# Patient Record
Sex: Female | Born: 1955 | Hispanic: No | Smoking: Never smoker
Health system: Southern US, Community
[De-identification: ages and names within clinical notes are randomized; demographics above are authoritative.]

## PROBLEM LIST (undated history)

## (undated) DIAGNOSIS — C50919 Malignant neoplasm of unspecified site of unspecified female breast: Secondary | ICD-10-CM

## (undated) DIAGNOSIS — IMO0002 Reserved for concepts with insufficient information to code with codable children: Secondary | ICD-10-CM

## (undated) HISTORY — PX: OTHER SURGICAL HISTORY: SHX169

## (undated) NOTE — *Deleted (*Deleted)
[facility]  [phone] [phone]     ID: Kelly Yu DOB: June 01, 1955  MR#: [id number]  JYN#:[id number]  Patient Care Team: Patient, No Pcp Per as PCP - General (General Practice) [name], [name], MD as Consulting Physician (Oncology) [name], MD as Consulting Physician (General Surgery) [name], MD as Consulting Physician (Radiation Oncology) [name], NP as Nurse Practitioner (Hematology and Oncology) [name] OTHER MD:  CHIEF COMPLAINT: Invasive ductal carcinoma, unknown prognostic panel  CURRENT TREATMENT: anastrozole   INTERVAL HISTORY: Kelly Yu returns today for follow-up of her estrogen receptor positive breast cancer accompanied by her daughter and arabic Nurse, learning disability.  She continues on anastrozole.  She tolerates this with no side effects that she is aware of.  At her last visit, she was noted to have a palpable area at the site of her previous right lumpectomy. She underwent bilateral diagnostic mammography with tomography and right breast ultrasonography at [facility] on 03/11/2019 showing: breast density category B; although probably related to changes of fat necrosis, 1.1 cm oval mass in right breast is considered suspicious.   She proceeded to biopsy of the right breast area in question on 03/16/2019. Pathology [id number]) showed dense fibrosis with calcifications.  Of note, she also had pap smear done the same day, which was negative.   REVIEW OF SYSTEMS: Kelly Yu    COVID 19 VACCINATION STATUS:    BREAST CANCER HISTORY: On the original intake note:  Kelly Yu tells me she had pain in the right breast, which brought her to a physician in [place], where she normally resides. They obtained a mammogram, but she does not know those results. 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 There is no prognostic panel  More recently, while visiting family in [place], the patient presented to the emergency department with a complaint of right breast pain and redness. She was afebrile. Needle aspiration was obtained and the patient was started on Augmentin. The aspirate cultures remain negative.  On 12/22/2015 [facility] This showed, at the 11:00 position of the right breast, 8 cm from the nipple, a large complex cystic region measuring up to 8.5 cm. There was no hyperemia.   The patient was referred to surgery, and on 01/18/2016 she had bilateral diagnostic mammography with tomography and right breast [facility]. The breast density was category B. In the upper outer right breast there was a 5.7 cm mass, with an associated 1 cm satellite. There was a large firm mass deforming the contour of the breast and targeted ultrasound demonstrated a large complex collection in the upper outer quadrant of the breast measuring up to 4.8 cm. A borderline lymph node with a moderately thickened cortex was visualized in the right axilla.  Biopsy of the right breast mass and right axillary lymph node 01/25/2016 showed ([id number]) no evidence of malignancy. Cultures from the 01/25/2016 biopsy all remained negative.  She was referred to radiation oncology to receive adjuvant radiation. Her subsequent history is as detailed below   PAST MEDICAL HISTORY: Past Medical History:  Diagnosis Date  . Breast cancer (HCC)    Diagnosed in [place] in July 2017, Stage IA, T1c, Nx, Mx invasive ductal carcinoma  . Ulcer  PAST SURGICAL HISTORY: Past Surgical History:  Procedure Laterality Date  . right breast lumpectomy      FAMILY HISTORY Family History  Problem Relation Age of Onset  . Diabetes Sister   The  patient's father died from renal failure in his 39s the patient's mother died from a blood clot in her 1s. The patient had 4 brothers, 8 sisters. There is no history of breast or ovarian cancer in the family to the patient's knowledge   GYNECOLOGIC HISTORY:  No LMP recorded. Patient is postmenopausal. Menarche age 49, first live birth age 21, the patient is GX B8. She is now postmenopausal. She never took hormone replacement.   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 While visiting in the [place] she lives with her daughter Kelly Yu. Kelly Yu works as a Neurosurgeon. Also at home are the patient's son-in-law Kelly Yu , who works for [employer], and their 3 children.    ADVANCED DIRECTIVES: The patient's daughter Kelly Yu and son-in-law [name] are jointly the patient's healthcare power of attorney    HEALTH MAINTENANCE: Social History   Tobacco Use  . Smoking status: Never Smoker  . Smokeless tobacco: Never Used  Vaping Use  . Vaping Use: Never used  Substance Use Topics  . Alcohol use: No  . Drug use: No     Colonoscopy:  PAP: 03/2019, negative  Bone density:   No Known Allergies  Current Outpatient Medications  Medication Sig Dispense Refill  . anastrozole (ARIMIDEX) 1 MG tablet Take 1 tablet (1 mg total) by mouth daily. 180 tablet 1  . cholecalciferol (VITAMIN D) 1000 units tablet Take 1 tablet (1,000 Units total) by mouth daily. 90 tablet 4  . triamcinolone cream (KENALOG) 0.1 % Apply 1 application topically 2 (two) times daily. 30 g 0   No current facility-administered medications for this visit.    OBJECTIVE: Middle-aged Middle [place] woman in no acute distress  There were no vitals filed for this visit.   There is no height or weight on file to calculate BMI.    ECOG FS:1 - Symptomatic but completely ambulatory  Sclerae unicteric, EOMs intact Wearing a mask No cervical or supraclavicular adenopathy  Lungs no rales or rhonchi Heart regular rate and rhythm Abd soft, nontender, positive bowel sounds MSK no focal spinal tenderness, no upper extremity lymphedema Neuro: nonfocal, well oriented, appropriate affect Breasts:    {Sclerae unicteric, EOMs intact Wearing a mask No cervical or supraclavicular adenopathy Lungs no rales or rhonchi Heart regular rate and rhythm Abd soft, nontender, positive bowel sounds MSK no focal spinal tenderness, no upper extremity lymphedema Neuro: nonfocal, well oriented, appropriate affect Breasts: The right breast is status post lumpectomy and radiation.  There are significant irregularities including a hard mass associated with the scar and a second mass in the lower inner quadrant.  The left breast is unremarkable.  Both axillae are benign}   LAB RESULTS:  CMP     Component Value Date/Time   NA 140 03/08/2019 1017   NA 141 12/15/2016 1606   K 4.2 03/08/2019 1017   K 4.1 12/15/2016 1606   CL 106 03/08/2019 1017   CO2 26 03/08/2019 1017   CO2 27 12/15/2016 1606   GLUCOSE 103 (H) 03/08/2019 1017   GLUCOSE 124 12/15/2016 1606   BUN 15 03/08/2019 1017   BUN 17.1 12/15/2016 1606   CREATININE 0.75 03/08/2019 1017   CREATININE 0.8 12/15/2016 1606  CALCIUM 8.9 03/08/2019 1017   CALCIUM 9.3 12/15/2016 1606   PROT 7.7 03/08/2019 1017   PROT 7.5 12/15/2016 1606   ALBUMIN 3.9 03/08/2019 1017   ALBUMIN 3.7 12/15/2016 1606   AST 19 03/08/2019 1017   AST 21 12/15/2016 1606   ALT 12 03/08/2019 1017   ALT 16 12/15/2016 1606   ALKPHOS 85 03/08/2019 1017   ALKPHOS 103 12/15/2016 1606   BILITOT 0.4 03/08/2019 1017   BILITOT 0.36 12/15/2016 1606   GFRNONAA >60 03/08/2019 1017   GFRAA >60 03/08/2019 1017    INo results found for: SPEP, UPEP  Lab Results  Component Value Date   WBC 6.8 03/08/2019   NEUTROABS 3.9 03/08/2019   HGB 12.0 03/08/2019   HCT 36.7 03/08/2019   MCV 90.2 03/08/2019   PLT 204 03/08/2019      Chemistry      Component  Value Date/Time   NA 140 03/08/2019 1017   NA 141 12/15/2016 1606   K 4.2 03/08/2019 1017   K 4.1 12/15/2016 1606   CL 106 03/08/2019 1017   CO2 26 03/08/2019 1017   CO2 27 12/15/2016 1606   BUN 15 03/08/2019 1017   BUN 17.1 12/15/2016 1606   CREATININE 0.75 03/08/2019 1017   CREATININE 0.8 12/15/2016 1606      Component Value Date/Time   CALCIUM 8.9 03/08/2019 1017   CALCIUM 9.3 12/15/2016 1606   ALKPHOS 85 03/08/2019 1017   ALKPHOS 103 12/15/2016 1606   AST 19 03/08/2019 1017   AST 21 12/15/2016 1606   ALT 12 03/08/2019 1017   ALT 16 12/15/2016 1606   BILITOT 0.4 03/08/2019 1017   BILITOT 0.36 12/15/2016 1606       No results found for: LABCA2  No components found for: LABCA125  No results for input(s): INR in the last 168 hours.  Urinalysis    Component Value Date/Time   COLORURINE YELLOW 11/13/2012 1059   APPEARANCEUR CLEAR 11/13/2012 1059   LABSPEC 1.010 11/13/2012 1059   PHURINE 8.5 (H) 11/13/2012 1059   GLUCOSEU NEGATIVE 11/13/2012 1059   HGBUR NEGATIVE 11/13/2012 1059   BILIRUBINUR NEGATIVE 11/13/2012 1059   KETONESUR NEGATIVE 11/13/2012 1059   PROTEINUR NEGATIVE 11/13/2012 1059   UROBILINOGEN 0.2 11/13/2012 1059   NITRITE NEGATIVE 11/13/2012 1059   LEUKOCYTESUR TRACE (A) 11/13/2012 1059    STUDIES: No results found.   ELIGIBLE FOR AVAILABLE RESEARCH PROTOCOL: No  ASSESSMENT: 15 y.o. [place] Woman, originally from [place], status post right breast upper outer quadrant lumpectomy July 2017 for a pT1c pNX, stage IA invasive ductal carcinoma,  grade 3, prognostic panel not available  (1) complex cystic mass in the right breast and suspicious right axillary lymph node both biopsied 01/25/2016, showing no evidence of malignancy. Multiple cultures were negative as well   (2) adjuvant radiation 03/23/2016 to 05/15/2015:  (a) Right Breast / 50.4 Gy in 28 fractions  (b) Boost / 14 Gy in 7 fractions  (3) started anastrozole 06/05/2016  (a) bone  density at the [facility] 12/08/2016 finds a T score of -1.5   PLAN: Kelly Yu is now a little over 3 years out from definitive surgery for breast cancer with no evidence of disease recurrence.  This is very favorable.  She is tolerating anastrozole well and the plan is to continue that for a minimum of 5 years.  She will be leaving for [place] 03/19/2019.  It is not clear when she will be able to obtain a visa to return.  I  have refilled her anastrozole here so she has a 56-month supply and I have written her a paper prescription that she can take to [place] and get it refilled there.  I also gave her my card so her doctors in [place] can call me if they have any questions.  I am concerned about the irregularities in the right breast and I have put her in for her mammogram and ultrasound this week.  If they can do the left breast that will save her having to have mammography again in March since he will not be in the country at that time  Assuming she is back in the [place] a year from now she will see me then.  She knows to call for any other issue that may develop before that visit.   Valentino Yu. [name], MD  03/07/20 11:34 PM Medical Oncology and Hematology [facility] [address] [place], [place] [address] Tel. [phone]    Fax. [phone]   I, Mickie Bail, am acting as scribe for Dr. Valentino Yu. [name].  I, Ruthann Cancer MD, have reviewed the above documentation for accuracy and completeness, and I agree with the above.   *Total Encounter Time as defined by the Centers for Medicare and Medicaid Services includes, in addition to the face-to-face time of a patient visit (documented in the note above) non-face-to-face time: obtaining and reviewing outside history, ordering and reviewing medications, tests or procedures, care coordination (communications with other health care professionals or caregivers) and documentation in the medical record.

---

## 2012-11-13 ENCOUNTER — Other Ambulatory Visit (HOSPITAL_COMMUNITY): Payer: Self-pay

## 2012-11-13 ENCOUNTER — Emergency Department (HOSPITAL_COMMUNITY): Payer: Self-pay

## 2012-11-13 ENCOUNTER — Emergency Department (HOSPITAL_COMMUNITY)
Admission: EM | Admit: 2012-11-13 | Discharge: 2012-11-13 | Disposition: A | Discharge status: Home / Self Care | Payer: Self-pay | Attending: Emergency Medicine | Admitting: Emergency Medicine

## 2012-11-13 ENCOUNTER — Encounter (HOSPITAL_COMMUNITY): Payer: Self-pay | Admitting: Family Medicine

## 2012-11-13 DIAGNOSIS — Z872 Personal history of diseases of the skin and subcutaneous tissue: Secondary | ICD-10-CM | POA: Insufficient documentation

## 2012-11-13 DIAGNOSIS — R51 Headache: Secondary | ICD-10-CM | POA: Insufficient documentation

## 2012-11-13 DIAGNOSIS — R5383 Other fatigue: Secondary | ICD-10-CM | POA: Insufficient documentation

## 2012-11-13 DIAGNOSIS — R42 Dizziness and giddiness: Secondary | ICD-10-CM | POA: Insufficient documentation

## 2012-11-13 DIAGNOSIS — R5381 Other malaise: Secondary | ICD-10-CM | POA: Insufficient documentation

## 2012-11-13 HISTORY — DX: Reserved for concepts with insufficient information to code with codable children: IMO0002

## 2012-11-13 LAB — BASIC METABOLIC PANEL
BUN: 15 mg/dL (ref 6–23)
Calcium: 9.2 mg/dL (ref 8.4–10.5)
Creatinine, Ser: 0.6 mg/dL (ref 0.50–1.10)
GFR calc Af Amer: >90 mL/min (ref >90)
GFR calc non Af Amer: >90 mL/min (ref >90)
Potassium: 3.8 mEq/L (ref 3.5–5.1)

## 2012-11-13 LAB — CBC WITH DIFFERENTIAL/PLATELET
Basophils Relative: 0 % (ref 0–1)
Eosinophils Absolute: 0.2 10*3/uL (ref 0.0–0.7)
Hemoglobin: 12.2 g/dL (ref 12.0–15.0)
MCH: 29.1 pg (ref 26.0–34.0)
MCHC: 33.7 g/dL (ref 30.0–36.0)
Monocytes Relative: 8 % (ref 3–12)
Neutrophils Relative %: 64 % (ref 43–77)

## 2012-11-13 LAB — URINALYSIS, ROUTINE W REFLEX MICROSCOPIC
Bilirubin Urine: NEGATIVE
Ketones, ur: NEGATIVE mg/dL
Nitrite: NEGATIVE
Urobilinogen, UA: 0.2 mg/dL (ref 0.0–1.0)
pH: 8.5 — ABNORMAL HIGH (ref 5.0–8.0)

## 2012-11-13 LAB — POCT I-STAT TROPONIN I

## 2012-11-13 MED ORDER — SODIUM CHLORIDE 0.9 % IV BOLUS (SEPSIS)
1000.0000 mL | Freq: Once | INTRAVENOUS | Status: AC
  Administered 2012-11-13: 1000 mL via INTRAVENOUS

## 2012-11-13 MED ORDER — KETOROLAC TROMETHAMINE 30 MG/ML IJ SOLN
30.0000 mg | Freq: Once | INTRAMUSCULAR | Status: AC
  Administered 2012-11-13: 30 mg via INTRAVENOUS
  Filled 2012-11-13: qty 1

## 2012-11-13 MED ORDER — PROMETHAZINE HCL 25 MG/ML IJ SOLN
12.5000 mg | Freq: Once | INTRAMUSCULAR | Status: AC
  Administered 2012-11-13: 12.5 mg via INTRAVENOUS
  Filled 2012-11-13: qty 1

## 2012-11-13 MED ORDER — OXYCODONE-ACETAMINOPHEN 5-325 MG PO TABS
1.0000 | ORAL_TABLET | Freq: Once | ORAL | Status: AC
  Administered 2012-11-13: 1 via ORAL
  Filled 2012-11-13: qty 1

## 2012-11-13 NOTE — Progress Notes (Signed)
Met with  Patient,son in law at bedside, case manager role explained.Patient does not have a PCP. Patient provided with resource sheet for [facility].Patient,son in law verbalized understanding of case Production designer, theatre/television/film education.

## 2012-11-13 NOTE — ED Notes (Signed)
Patient transported to CT 

## 2012-11-13 NOTE — ED Provider Notes (Signed)
History    CSN: [id number] Arrival date & time 11/13/12  4098  First MD Initiated Contact with Patient 11/13/12 0957     Chief Complaint  Patient presents with  . Dizziness  . Fall   (Consider location/radiation/quality/duration/timing/severity/associated sxs/prior Treatment) HPI Comments: 57 y.o. Female with distant history of stomach ulcers (21 years ago) presents today complaining of gradual onset frontal headache with associated dizziness. Pt speaks Arabic. Son-in-law served as Nurse, learning disability. Pt describes the headache as gradual onset, severe (9/10), throbbing, constant, frontal radiating to the back of her head. Pt states that she had the headache when she woke up (pt sleeps on the floor due to back pain). She got up to go to the bathroom, laid back down, and started to feel dizzy when she sat up to get up again. She called for her daughter and son-in-law with whom she lives, stating she was feeling dizzy and week. Pt did not "fall" as per nurse's note, but clarified by son-in-law that she was sitting up and "fell" back to lie down due to being weak as sitting up was too much effort.  Pt is fasting for Ramadan, but reports never having felt dizzy before during the fast. Pt is visiting from [place]   Patient is a 57 y.o. female presenting with fall.  Fall Associated symptoms include headaches and weakness. Pertinent negatives include no chest pain, diaphoresis, fever, nausea, neck pain, numbness, rash or vomiting.   Past Medical History  Diagnosis Date  . Ulcer    History reviewed. No pertinent past surgical history. History reviewed. No pertinent family history. History  Substance Use Topics  . Smoking status: Never Smoker   . Smokeless tobacco: Not on file  . Alcohol Use: No   OB History   Grav Para Term Preterm Abortions TAB SAB Ect Mult Living                 Review of Systems  Constitutional: Negative for fever and diaphoresis.  HENT: Negative for neck pain and neck  stiffness.   Eyes: Negative for visual disturbance.  Respiratory: Negative for apnea, chest tightness and shortness of breath.   Cardiovascular: Negative for chest pain and palpitations.  Gastrointestinal: Negative for nausea, vomiting, diarrhea and constipation.  Genitourinary: Negative for dysuria.  Musculoskeletal: Negative for gait problem.  Skin: Negative for rash.  Neurological: Positive for weakness and headaches. Negative for dizziness, syncope, light-headedness and numbness.       Frontal    Allergies  Review of patient's allergies indicates no known allergies.  Home Medications  No current outpatient prescriptions on file. BP 144/76  Pulse 64  Temp(Src) 98.1 F (36.7 C) (Oral)  Resp 16  SpO2 100% Physical Exam  Nursing note and vitals reviewed. Constitutional: She is oriented to person, place, and time. She appears well-developed and well-nourished. No distress.  HENT:  Head: Normocephalic and atraumatic.  Eyes: Conjunctivae and EOM are normal. Pupils are equal, round, and reactive to light.  Neck: Normal range of motion. Neck supple.  No meningeal signs  Cardiovascular: Normal rate, regular rhythm and normal heart sounds.  Exam reveals no gallop and no friction rub.   No murmur heard. Pulmonary/Chest: Effort normal and breath sounds normal. No respiratory distress. She has no wheezes. She has no rales. She exhibits no tenderness.  Abdominal: Soft. Bowel sounds are normal. She exhibits no distension. There is no tenderness. There is no rebound and no guarding.  Musculoskeletal: Normal range of motion. She exhibits no edema  and no tenderness.  FROM to upper and lower extremities No step-offs noted on C-spine No tenderness to palpation of the spinous processes of the C-spine, T-spine or L-spine Full range of motion of C-spine, T-spine or L-spine No tenderness to palpation of the paraspinous muscles   Neurological: She is alert and oriented to person, place, and time.  No cranial nerve deficit.  Speech is clear and goal oriented, follows commands Sensation normal to light touch and two point discrimination Moves extremities without ataxia, coordination intact Normal gait and balance Normal strength in upper and lower extremities bilaterally including dorsiflexion and plantar flexion, strong and equal grip strength   Skin: Skin is warm and dry. She is not diaphoretic. No erythema.  Psychiatric: She has a normal mood and affect.    ED Course  Procedures (including critical care time) Labs Reviewed  URINALYSIS, ROUTINE W REFLEX MICROSCOPIC - Abnormal; Notable for the following:    pH 8.5 (*)    Leukocytes, UA TRACE (*)    All other components within normal limits  BASIC METABOLIC PANEL - Abnormal; Notable for the following:    Glucose, Bld 126 (*)    All other components within normal limits  CBC WITH DIFFERENTIAL  URINE MICROSCOPIC-ADD ON  POCT I-STAT TROPONIN I   Dg Chest 2 View  11/13/2012   *RADIOLOGY REPORT*  Clinical Data: Severe chest pain.  CHEST - 2 VIEW  Comparison: The  Findings: The heart size is upper limits of normal.  The lungs are clear.  There is no edema or effusion to suggest failure.  The visualized soft tissues and bony thorax are unremarkable.  IMPRESSION:  1.  The heart size is upper limits of normal without evidence for failure. 2.  No acute cardiopulmonary disease.   Original Report Authenticated By: Marin Roberts, M.D.   Ct Head Wo Contrast  11/13/2012   *RADIOLOGY REPORT*  Clinical Data:  Dizziness and headache.  CT HEAD WITHOUT CONTRAST  Technique:  Contiguous axial images were obtained from the base of the skull through the vertex without contrast  Comparison:  None.  Findings:  The brain has a normal appearance without evidence for hemorrhage, acute infarction, hydrocephalus, or mass lesion.  There is no extra axial fluid collection.  The skull and paranasal sinuses are normal.  IMPRESSION: Normal CT of the head without  contrast.   Original Report Authenticated By: Janeece Riggers, M.D.   1. Dizziness   2. Headache     MDM  Pt HA treated and improved while in ED.  Presentation is like typical HA and non concerning for [facility], ICH, Meningitis, or temporal arteritis. Pt is afebrile with no focal neuro deficits, nuchal rigidity, or change in vision. Pt is visiting from [place] and has no PCP follow up. Provided resource list, discussed availability of urgent cares, and reasons to return to the ED.  At this time there does not appear to be any evidence of an acute emergency medical condition and the patient appears stable for discharge with appropriate outpatient follow up.Diagnosis was discussed with patient who verbalizes understanding and is agreeable to discharge. Pt case discussed with Dr. Fredderick Phenix who agrees with my plan.      Glade Nurse, PA-C 11/13/12 1745

## 2012-11-13 NOTE — ED Notes (Addendum)
Per family pt woke up this am and was feeling very dizzy and fell. sts still currently dizzy. sts heaviness in head. sts some nausea. Denies hitting head or LOC. Per family they are currently fasting for religious reasons.

## 2012-11-13 NOTE — ED Notes (Signed)
Ambulated to bathroom without difficulty, daughter at side. C/o headache

## 2012-11-14 NOTE — ED Provider Notes (Signed)
Medical screening examination/treatment/procedure(s) were performed by non-physician practitioner and as supervising physician I was immediately available for consultation/collaboration.   Brennyn Ortlieb, MD 11/14/12 0715 

## 2015-12-22 ENCOUNTER — Emergency Department (HOSPITAL_COMMUNITY): Payer: Self-pay

## 2015-12-22 ENCOUNTER — Emergency Department (HOSPITAL_COMMUNITY)
Admission: EM | Admit: 2015-12-22 | Discharge: 2015-12-22 | Disposition: A | Discharge status: Home / Self Care | Payer: Self-pay | Attending: Emergency Medicine | Admitting: Emergency Medicine

## 2015-12-22 ENCOUNTER — Encounter (HOSPITAL_COMMUNITY): Payer: Self-pay | Admitting: *Deleted

## 2015-12-22 DIAGNOSIS — N611 Abscess of the breast and nipple: Secondary | ICD-10-CM | POA: Insufficient documentation

## 2015-12-22 DIAGNOSIS — N63 Unspecified lump in unspecified breast: Secondary | ICD-10-CM | POA: Diagnosis present

## 2015-12-22 DIAGNOSIS — IMO0002 Reserved for concepts with insufficient information to code with codable children: Secondary | ICD-10-CM | POA: Diagnosis present

## 2015-12-22 LAB — CBC WITH DIFFERENTIAL/PLATELET
Basophils Absolute: 0 10*3/uL (ref 0.0–0.1)
Basophils Relative: 0 %
Eosinophils Absolute: 0.3 10*3/uL (ref 0.0–0.7)
Eosinophils Relative: 4 %
HEMATOCRIT: 34.2 % — AB (ref 36.0–46.0)
HEMOGLOBIN: 11.1 g/dL — AB (ref 12.0–15.0)
LYMPHS ABS: 2.2 10*3/uL (ref 0.7–4.0)
Lymphocytes Relative: 33 %
MCH: 28.8 pg (ref 26.0–34.0)
MCHC: 32.5 g/dL (ref 30.0–36.0)
MCV: 88.6 fL (ref 78.0–100.0)
MONO ABS: 0.7 10*3/uL (ref 0.1–1.0)
MONOS PCT: 11 %
NEUTROS ABS: 3.4 10*3/uL (ref 1.7–7.7)
NEUTROS PCT: 52 %
Platelets: 188 10*3/uL (ref 150–400)
RBC: 3.86 MIL/uL — ABNORMAL LOW (ref 3.87–5.11)
RDW: 14.1 % (ref 11.5–15.5)
WBC: 6.6 10*3/uL (ref 4.0–10.5)

## 2015-12-22 LAB — BASIC METABOLIC PANEL
Anion gap: 6 (ref 5–15)
BUN: 17 mg/dL (ref 6–20)
CALCIUM: 8.8 mg/dL — AB (ref 8.9–10.3)
CHLORIDE: 105 mmol/L (ref 101–111)
CO2: 30 mmol/L (ref 22–32)
CREATININE: 0.6 mg/dL (ref 0.44–1.00)
GFR calc Af Amer: >60 mL/min (ref >60)
GFR calc non Af Amer: >60 mL/min (ref >60)
GLUCOSE: 100 mg/dL — AB (ref 65–99)
Potassium: 4.2 mmol/L (ref 3.5–5.1)
Sodium: 141 mmol/L (ref 135–145)

## 2015-12-22 LAB — CBG MONITORING, ED: Glucose-Capillary: 99 mg/dL (ref 65–99)

## 2015-12-22 MED ORDER — AMOXICILLIN-POT CLAVULANATE 875-125 MG PO TABS: 1.0000 | ORAL_TABLET | Freq: Two times a day (BID) | ORAL | 0 refills | Status: DC

## 2015-12-22 MED ORDER — CLINDAMYCIN PHOSPHATE 600 MG/50ML IV SOLN
600.0000 mg | Freq: Once | INTRAVENOUS | Status: AC
  Administered 2015-12-22: 600 mg via INTRAVENOUS
  Filled 2015-12-22: qty 50

## 2015-12-22 NOTE — ED Notes (Signed)
No respiratory or acute distress noted alert and oriented x 3 visitors at bedside call light in reach. 

## 2015-12-22 NOTE — ED Triage Notes (Signed)
Patient had a lumpectomy done in [place], her home country, last month. Daughter states patient did not receive results of biopsy, but now c/o right breast pain x2 days.  Patient denies fever and N/V.    Patient has healed incision on right breast with some local edema and erythema to under side of right breast.

## 2015-12-22 NOTE — ED Provider Notes (Signed)
[facility] DEPT Provider Note   CSN: [id number] Arrival date & time: 12/22/15  1504     History   Chief Complaint Chief Complaint  Patient presents with  . Breast Pain    HPI Kelly Yu is a 60 y.o. female.  Patient presents today with a chief complaint of right breast pain and erythema.  She had a lumpectomy six weeks ago in [place] and reports that she was told that it is was not cancerous.  She states that over the past 2-3 days she has developed pain and it is gradually worsening.  She is currently not taking any medications.  No nausea, vomiting, fever, or chills.  She has not noticed any drainage from the nipple.  She denies any other symptoms.        Past Medical History:  Diagnosis Date  . Ulcer     There are no active problems to display for this patient.   History reviewed. No pertinent surgical history.  OB History    No data available       Home Medications    Prior to Admission medications   Not on File    Family History No family history on file.  Social History Social History  Substance Use Topics  . Smoking status: Never Smoker  . Smokeless tobacco: Never Used  . Alcohol use No     Allergies   Review of patient's allergies indicates no known allergies.   Review of Systems Review of Systems  All other systems reviewed and are negative.    Physical Exam Updated Vital Signs BP 142/78 (BP Location: Left Arm)   Pulse (!) 59   Temp 98.7 F (37.1 C) (Oral)   Resp 21   SpO2 97%   Physical Exam  Constitutional: She appears well-developed and well-nourished.  HENT:  Head: Normocephalic and atraumatic.  Neck: Normal range of motion. Neck supple.  Cardiovascular: Normal rate, regular rhythm and normal heart sounds.   Pulmonary/Chest: Effort normal and breath sounds normal. Right breast exhibits skin change and tenderness. Right breast exhibits no inverted nipple and no nipple discharge.    Genitourinary: No breast  discharge or bleeding.  Musculoskeletal: Normal range of motion.  Lymphadenopathy:    She has no axillary adenopathy.  Neurological: She is alert.  Skin: Skin is warm and dry. There is erythema.  Nursing note and vitals reviewed.    ED Treatments / Results  Labs (all labs ordered are listed, but only abnormal results are displayed) Labs Reviewed  CBC WITH DIFFERENTIAL/PLATELET  BASIC METABOLIC PANEL    EKG  EKG Interpretation  Date/Time:  Saturday December 22 2015 15:12:39 EDT Ventricular Rate:  56 PR Interval:    QRS Duration: 104 QT Interval:  389 QTC Calculation: 376 R Axis:   39 Text Interpretation:  Sinus rhythm Low voltage, precordial leads Abnormal R-wave progression, early transition Atrial escape complexes Confirmed by Wyvonnia Dusky  MD, STEPHEN [id number]) on 12/22/2015 4:18:18 PM       Radiology No results found.  Procedures Procedures (including critical care time)  NEEDLE ASPIRATION Performed by: Hyman Bible Consent: Verbal consent obtained. Risks and benefits: risks, benefits and alternatives were discussed Type: abscess  Body area: right breast  Needle aspiration  Complexity: complex Blunt dissection to break up loculations  Drainage: bloody with small amount of purulent fluid  Drainage amount: 3 cc  Patient tolerance: Patient tolerated the procedure well with no immediate complications.     Medications Ordered in ED Medications  clindamycin (CLEOCIN)  IVPB 600 mg (not administered)     Initial Impression / Assessment and Plan / ED Course  I have reviewed the triage vital signs and the nursing notes.  Pertinent labs & imaging results that were available during my care of the patient were reviewed by me and considered in my medical decision making (see chart for details).  Clinical Course    7:42 PM Discussed with Dr Johney Maine with General Surgery.  He recommended attempting to aspirate the abscess with a 18 Gauge needle and starting her on  Augmentin.  He states that she can be seen by the   Final Clinical Impressions(s) / ED Diagnoses   Final diagnoses:  Abscess of right breast   Patient with a recent lumpectomy done in [place] six weeks ago presents today with increased pain and erythema of the right breast.  She is afebrile.  Exam consistent with breast abscess vs seroma.  Ultrasound performed in the ED by Ultrasound tech and was suspicious for breast abscess.  Discussed patient with Dr Johney Maine with General Surgery.  He recommended needle aspiration, Augmentin, and follow up in the office.  Needle aspiration performed.  Patient given follow up with Surgery.  She is afebrile and non toxic appearing.  No signs of systemic infection.  Stable for discharge.  Return precautions given.   New Prescriptions New Prescriptions   No medications on file     Hyman Bible, PA-C 12/25/15 [place], PA-C 12/25/15 [place], PA-C 12/25/15 [place], MD 12/26/15 [phone]

## 2015-12-22 NOTE — Discharge Instructions (Signed)
[facility] °Office Phone Number [phone] ° °BREAST BIOPSY/ PARTIAL MASTECTOMY: POST OP INSTRUCTIONS ° °Always review your discharge instruction sheet given to you by the facility where your surgery was performed. ° °IF YOU HAVE DISABILITY OR FAMILY LEAVE FORMS, YOU MUST BRING THEM TO THE OFFICE FOR PROCESSING.  DO NOT GIVE THEM TO YOUR DOCTOR. ° °1. A prescription for pain medication may be given to you upon discharge.  Take your pain medication as prescribed, if needed.  If narcotic pain medicine is not needed, then you may take acetaminophen (Tylenol) or ibuprofen (Advil) as needed. °2. Take your usually prescribed medications unless otherwise directed °3. If you need a refill on your pain medication, please contact your pharmacy.  They will contact our office to request authorization.  Prescriptions will not be filled after 5pm or on week-ends. °4. You should eat very light the first 24 hours after surgery, such as soup, crackers, pudding, etc.  Resume your normal diet the day after surgery. °5. Most patients will experience some swelling and bruising in the breast.  Ice packs and a good support bra will help.  Swelling and bruising can take several days to resolve.  °6. It is common to experience some constipation if taking pain medication after surgery.  Increasing fluid intake and taking a stool softener will usually help or prevent this problem from occurring.  A mild laxative (Milk of Magnesia or Miralax) should be taken according to package directions if there are no bowel movements after 48 hours. °7. Unless discharge instructions indicate otherwise, you may remove your bandages 24-48 hours after surgery, and you may shower at that time.  You may have steri-strips (small skin tapes) in place directly over the incision.  These strips should be left on the skin for 7-10 days.  If your surgeon used skin glue on the incision, you may shower in 24 hours.  The glue will flake off over the  next 2-3 weeks.  Any sutures or staples will be removed at the office during your follow-up visit. °8. ACTIVITIES:  You may resume regular daily activities (gradually increasing) beginning the next day.  Wearing a good support bra or sports bra minimizes pain and swelling.  You may have sexual intercourse when it is comfortable. °a. You may drive when you no longer are taking prescription pain medication, you can comfortably wear a seatbelt, and you can safely maneuver your car and apply brakes. °b. RETURN TO WORK:  ______________________________________________________________________________________ °9. You should see your doctor in the office for a follow-up appointment approximately two weeks after your surgery.  Your doctor’s nurse will typically make your follow-up appointment when she calls you with your pathology report.  Expect your pathology report 2-3 business days after your surgery.  You may call to check if you do not hear from us after three days. °10. OTHER INSTRUCTIONS: _______________________________________________________________________________________________ _____________________________________________________________________________________________________________________________________ °_____________________________________________________________________________________________________________________________________ °_____________________________________________________________________________________________________________________________________ ° °WHEN TO CALL YOUR DOCTOR: °1. Fever over 101.0 °2. Nausea and/or vomiting. °3. Extreme swelling or bruising. °4. Continued bleeding from incision. °5. Increased pain, redness, or drainage from the incision. ° °The clinic staff is available to answer your questions during regular business hours.  Please don’t hesitate to call and ask to speak to one of the nurses for clinical concerns.  If you have a medical emergency, go to the nearest  emergency room or call 911.  A surgeon from [facility] is always on call at the hospital. ° °For further questions, please visit [email or web address]  ° ° °  Lumpectomy, Care After Refer to this sheet in the next few weeks. These instructions provide you with information on caring for yourself after your procedure. Your health care provider may also give you more specific instructions. Your treatment has been planned according to current medical practices, but problems sometimes occur. Call your health care provider if you have any problems or questions after your procedure. WHAT TO EXPECT AFTER THE PROCEDURE After your procedure, it is typical to have soreness, bruising, and swelling of your breast. This is normal. You will be given medicines to control your pain. HOME CARE INSTRUCTIONS  Take medicines only as directed by your health care provider.  Resume a normal diet as directed by your health care provider.  Resume normal activity as directed by your health care provider. Avoid strenuous activity that affects the arm on the side that the surgical cut (incision) was made. Avoid playing tennis, swimming, lifting heavy objects (those that weigh more than 10 pounds [4.5 kg]), and pulling for 2 weeks.  Change bandages (dressings) as directed by your health care provider.  Consider wearing a bra to bed if you feel discomfort at the breast. Wearing a bra also helps keep dressings on.  Keep all follow-up visits as directed by your health care provider. This is important.  Call for the results of your procedure as instructed by your surgeon. It is your responsibility to get your test results. Do not assume everything is fine if you have not heard from your health care provider.  Keep the incision site dry.  If the incision site is tender, applying an ice pack may relieve some discomfort. To do this:  Put ice in a plastic bag.  Place a towel between your skin and the  bag.  Leave the ice on for 15-20 minutes, 3-4 times a day. SEEK MEDICAL CARE IF:   You have increased bleeding from the incision site.  You notice redness, swelling, or increasing pain in the incision.  You have pus coming from the incision site.  You have a fever.  You notice a foul smell coming from the incision or dressing. SEEK IMMEDIATE MEDICAL CARE IF:   You develop a rash.  You have shortness of breath.  You have chest pain.   This information is not intended to replace advice given to you by your health care provider. Make sure you discuss any questions you have with your health care provider.   Document Released: 05/07/2006 Document Revised: 05/12/2014 Document Reviewed: 11/18/2012 Elsevier Interactive Patient Education 2016 Elsevier Inc[employer]  Seroma A seroma is a collection of fluid that looks like swelling or a mass on the body. Seromas form on the body where tissue has been injured or cut. They are most common after surgeries. Seromas vary in size. Some are small and painless. Others may become large and cause pain or discomfort. Many seromas go away on their own; the fluid is naturally absorbed by the body. Some may require the fluid to be drained through medical procedures.  CAUSES  Seromas form as the result of damage to tissue or the removal of tissue. This tissue damage may occur during surgery or because of an injury or trauma. When tissue is disrupted or removed, empty space is created. The body's natural defense system causes fluid to enter the empty space and form a seroma. SYMPTOMS   Swelling at the site of a surgical cut (incision) or an injury.  Drainage of clear fluid at the surgery or injury site.  Possible discomfort or pain. DIAGNOSIS  Your health care provider will perform a physical exam. During the exam, the health care provider will press on the seroma using a hand or fingers (palpation). Various tests may be ordered to help confirm the diagnosis.  These tests may include:  Blood tests.  Imaging tests such as ultrasonography or computed tomography (CT). TREATMENT  Sometimes seromas resolve on their own and drain naturally in the body. Your health care provider may monitor you to make sure the seroma does not cause any complications. If your seroma does not resolve on its own, treatment may include:  Using a needle to drain the fluid from the seroma (needle aspiration).  Inserting a flexible tube (catheter) to drain the fluid.  Applying a dressing, such as an elastic bandage or binder.  Use of antibiotic medicines if the seroma becomes infected.  In rare cases, surgery may be done to remove the seroma and repair the area. HOME CARE INSTRUCTIONS  Follow your health care provider's instructions regarding activity levels and any limitations on movements.  Only take over-the-counter or prescription medicines as directed by your health care provider.  If your health care provider prescribes antibiotics, take them as directed. Finish them even if you start to feel better.  Check your seroma every day for redness, warmth, or yellow drainage.  Follow up with your health care provider as directed. SEEK MEDICAL CARE IF:  You develop a fever.  You have pain, tenderness, redness, or warmth at the site of the seroma.  You notice yellow drainage coming from the site of the seroma.  Your seroma is getting bigger.   This information is not intended to replace advice given to you by your health care provider. Make sure you discuss any questions you have with your health care provider.   Document Released: 08/16/2012 Document Revised: 05/12/2014 Document Reviewed: 08/16/2012 Elsevier Interactive Patient Education [employer].

## 2015-12-25 LAB — AEROBIC CULTURE W GRAM STAIN (SUPERFICIAL SPECIMEN)

## 2015-12-25 LAB — AEROBIC CULTURE  (SUPERFICIAL SPECIMEN): CULTURE: NO GROWTH

## 2016-01-01 ENCOUNTER — Ambulatory Visit: Payer: Self-pay | Attending: Internal Medicine

## 2016-01-03 ENCOUNTER — Other Ambulatory Visit: Payer: Self-pay | Admitting: General Surgery

## 2016-01-14 ENCOUNTER — Other Ambulatory Visit (HOSPITAL_COMMUNITY): Payer: Self-pay | Admitting: *Deleted

## 2016-01-14 DIAGNOSIS — Z853 Personal history of malignant neoplasm of breast: Secondary | ICD-10-CM

## 2016-01-15 ENCOUNTER — Other Ambulatory Visit (HOSPITAL_COMMUNITY): Payer: Self-pay | Admitting: *Deleted

## 2016-01-15 DIAGNOSIS — Z853 Personal history of malignant neoplasm of breast: Secondary | ICD-10-CM

## 2016-01-18 ENCOUNTER — Other Ambulatory Visit (HOSPITAL_COMMUNITY): Payer: Self-pay | Admitting: Obstetrics and Gynecology

## 2016-01-18 ENCOUNTER — Encounter (HOSPITAL_COMMUNITY): Payer: Self-pay

## 2016-01-18 ENCOUNTER — Ambulatory Visit
Admission: RE | Admit: 2016-01-18 | Discharge: 2016-01-18 | Disposition: A | Discharge status: Home / Self Care | Payer: No Typology Code available for payment source | Source: Ambulatory Visit | Attending: Obstetrics and Gynecology | Admitting: Obstetrics and Gynecology

## 2016-01-18 ENCOUNTER — Ambulatory Visit (HOSPITAL_COMMUNITY)
Admission: RE | Admit: 2016-01-18 | Discharge: 2016-01-18 | Disposition: A | Discharge status: Home / Self Care | Payer: Self-pay | Source: Ambulatory Visit | Attending: Obstetrics and Gynecology | Admitting: Obstetrics and Gynecology

## 2016-01-18 VITALS — BP 160/92 | Temp 98.2°F | Ht 63.0 in | Wt 150.0 lb

## 2016-01-18 DIAGNOSIS — N6310 Unspecified lump in the right breast, unspecified quadrant: Secondary | ICD-10-CM

## 2016-01-18 DIAGNOSIS — Z853 Personal history of malignant neoplasm of breast: Secondary | ICD-10-CM

## 2016-01-18 DIAGNOSIS — R229 Localized swelling, mass and lump, unspecified: Principal | ICD-10-CM

## 2016-01-18 DIAGNOSIS — IMO0002 Reserved for concepts with insufficient information to code with codable children: Secondary | ICD-10-CM

## 2016-01-18 DIAGNOSIS — Z01419 Encounter for gynecological examination (general) (routine) without abnormal findings: Secondary | ICD-10-CM

## 2016-01-18 DIAGNOSIS — R599 Enlarged lymph nodes, unspecified: Secondary | ICD-10-CM

## 2016-01-18 NOTE — Patient Instructions (Signed)
Explained breast self awareness with Kelly Yu. Let patient know [facility] will cover Pap smears and HPV typing every 5 years unless has a history of abnormal Pap smears. Referred patient to the [facility] for a diagnostic mammogram and possible right breast ultrasound. Appointment scheduled for Friday, January 18, 2016 at 1510. Let patient know will follow up with her within the next couple weeks with results of Pap smear by phone. Kelly Yu verbalized understanding.  Kelly Yu, Arvil Chaco, RN 3:22 PM

## 2016-01-18 NOTE — Progress Notes (Signed)
Complaints of sharp right breast pain x 1 month that comes and goes. Patient rates pain at a 5-6 out of 10. Patient had a right breast lumpectomy in July 2017 due to breast cancer. Patient states that she feels a hard area where surgery was completed with some redness. Patient stated she did not have imaging completed for the left breast. Patient stated didn't know she had breast cancer until she came to the [place]. Per patient there is no documentation if all of the cancer was removed and it is unknown if has spread.   Pap Smear: Pap smear completed today. Last Pap smear was 30 years ago per patient and normal. Per patient has no history of an abnormal Pap smear. No Pap smear results are in EPIC.  Physical exam: Breasts Left breast slightly larger than right breast due to history of right breast lumpectomy in July 2017. No skin abnormalities left breast. Right inner and lower breast red around areola. No nipple retraction bilateral breasts. No nipple discharge bilateral breasts. No lymphadenopathy. No lumps palpated left breast. Palpated a firm mass right upper outer breast between 9 o'clock and 12 o'clock. No complaints of pain or tenderness on exam. Referred patient to the [facility] for a diagnostic mammogram and possible right breast ultrasound. Appointment scheduled for Friday, January 18, 2016 at 1510.  Pelvic/Bimanual   Ext Genitalia No lesions, no swelling and no discharge observed on external genitalia.         Vagina Vagina pink and normal texture. No lesions or discharge observed in vagina.          Cervix Cervix is present. Cervix pink and of normal texture. No discharge observed.     Uterus Uterus is present and palpable. Uterus in normal position and normal size.        Adnexae Bilateral ovaries present and palpable. No tenderness on palpation.          Rectovaginal No rectal exam completed today since patient had no rectal complaints. No skin  abnormalities observed on exam.    Smoking History: Patient has never smoked.  Patient Navigation: Patient education provided. Access to services provided for patient through [facility] program. Arabic interpreter provided.    Colorectal Cancer Screening: Patient has never had a colonoscopy. No complaints today.  Used Arabic interpreter Milbert Coulter from [facility].

## 2016-01-21 LAB — CYTOLOGY - PAP

## 2016-01-22 ENCOUNTER — Encounter (HOSPITAL_COMMUNITY): Payer: Self-pay | Admitting: *Deleted

## 2016-01-24 ENCOUNTER — Other Ambulatory Visit (HOSPITAL_COMMUNITY): Payer: Self-pay | Admitting: Obstetrics and Gynecology

## 2016-01-24 DIAGNOSIS — IMO0002 Reserved for concepts with insufficient information to code with codable children: Secondary | ICD-10-CM

## 2016-01-24 DIAGNOSIS — R229 Localized swelling, mass and lump, unspecified: Principal | ICD-10-CM

## 2016-01-25 ENCOUNTER — Ambulatory Visit
Admission: RE | Admit: 2016-01-25 | Discharge: 2016-01-25 | Disposition: A | Discharge status: Home / Self Care | Payer: No Typology Code available for payment source | Source: Ambulatory Visit | Attending: Obstetrics and Gynecology | Admitting: Obstetrics and Gynecology

## 2016-01-25 ENCOUNTER — Other Ambulatory Visit (HOSPITAL_COMMUNITY): Payer: Self-pay | Admitting: Obstetrics and Gynecology

## 2016-01-25 ENCOUNTER — Other Ambulatory Visit: Payer: Self-pay

## 2016-01-25 ENCOUNTER — Other Ambulatory Visit (HOSPITAL_COMMUNITY)
Admission: RE | Admit: 2016-01-25 | Discharge: 2016-01-25 | Disposition: A | Discharge status: Home / Self Care | Payer: No Typology Code available for payment source | Source: Ambulatory Visit | Attending: Diagnostic Radiology | Admitting: Diagnostic Radiology

## 2016-01-25 DIAGNOSIS — R599 Enlarged lymph nodes, unspecified: Secondary | ICD-10-CM

## 2016-01-25 DIAGNOSIS — IMO0002 Reserved for concepts with insufficient information to code with codable children: Secondary | ICD-10-CM

## 2016-01-25 DIAGNOSIS — R229 Localized swelling, mass and lump, unspecified: Principal | ICD-10-CM

## 2016-01-30 LAB — AEROBIC/ANAEROBIC CULTURE W GRAM STAIN (SURGICAL/DEEP WOUND): Culture: NO GROWTH

## 2016-02-25 ENCOUNTER — Encounter: Payer: Self-pay | Admitting: Oncology

## 2016-02-25 ENCOUNTER — Telehealth: Payer: Self-pay | Admitting: Oncology

## 2016-02-25 NOTE — Telephone Encounter (Signed)
Appt scheduled w/[name] on 11/9 @430pm . Spoke to the patient's son in law and gave him the appt. He states that his wife, patient's daughter, will be coming with her to the appts. Letter and directions mailed to the pt's address.

## 2016-03-04 NOTE — Progress Notes (Addendum)
Location of Breast Cancer: Right Breast Upper outer quadrant  (Needs Interpreter)  Histology per Pathology Report: F/22/2017INAL DIAGNOSIS Diagnosis 01/25/2016:  1. Breast, right, needle core biopsy, upper outer quadrant 438 cm complex cyst collection,  - NONDIAGNOSTIC MATERIAL, SEE COMMENT. 2. Lymph node, needle/core biopsy, right axillary - ONE BENIGN PARTIALLY SAMPLED LYMPH NODE WITH NO TUMOR SEEN (0/1). Diagnosis 9/22/217: BREAST, RIGHT, FINE NEEDLE ASPIRATION (SPECIMEN 1 OF 1,COLLECTED 01/25/16): THE SPECIMEN CONSISTS ALMOST ENTIRELY OF ACELLULAR DEBRIS, SEE COMMENT Receptor Status: ER(), PR (), Her2-neu (), Ki-()  Did patient present with symptoms (if so, please note symptoms) or was this found on screening mammography?:   Past/Anticipated interventions by surgeon, if any: Right Breast Lumpectomy in [place]  July 2017,  Clinic named [facility]  Dr. Rolm Bookbinder, MD seen on 02/20/2016   Past/Anticipated interventions by medical oncology, if any: Chemotherapy : Dr. Jana Hakim 03/13/2016 appt  Lymphedema issues, if any:  No  Pain issues, if any:  NO  SAFETY ISSUES:  Prior radiation?  No  Pacemaker/ICD? No  Possible current pregnancy? NO  Is the patient on methotrexate? No  Current Complaints / other details:  Menarche  Age 60,  Widowed, G8 P8,  8 children , menopause,  States has stomach ulcer, no meds, watches diet,  BP (!) 164/83 (BP Location: Left Arm, Patient Position: Sitting, Cuff Size: Normal)   Pulse 78   Temp 97.9 F (36.6 C) (Oral)   Resp 16   Ht 5' 3"  (1.6 m)   Wt 154 lb (69.9 kg)   BMI 27.28 kg/m   Wt Readings from Last 3 Encounters:  03/06/16 154 lb (69.9 kg)  01/18/16 150 lb (68 kg)      Rebecca Eaton, RN 03/04/2016,3:26 PM  Allergies:NKA

## 2016-03-06 ENCOUNTER — Encounter: Payer: Self-pay | Admitting: Radiation Oncology

## 2016-03-06 ENCOUNTER — Ambulatory Visit
Admission: RE | Admit: 2016-03-06 | Discharge: 2016-03-06 | Disposition: A | Discharge status: Home / Self Care | Payer: Self-pay | Source: Ambulatory Visit | Attending: Radiation Oncology | Admitting: Radiation Oncology

## 2016-03-06 ENCOUNTER — Institutional Professional Consult (permissible substitution): Payer: No Typology Code available for payment source | Admitting: Radiation Oncology

## 2016-03-06 VITALS — BP 164/83 | HR 78 | Temp 97.9°F | Resp 16 | Ht 63.0 in | Wt 154.0 lb

## 2016-03-06 DIAGNOSIS — C50411 Malignant neoplasm of upper-outer quadrant of right female breast: Secondary | ICD-10-CM

## 2016-03-06 DIAGNOSIS — Z510 Encounter for antineoplastic radiation therapy: Secondary | ICD-10-CM | POA: Insufficient documentation

## 2016-03-06 DIAGNOSIS — Z833 Family history of diabetes mellitus: Secondary | ICD-10-CM | POA: Insufficient documentation

## 2016-03-06 HISTORY — DX: Malignant neoplasm of unspecified site of unspecified female breast: C50.919

## 2016-03-06 NOTE — Progress Notes (Signed)
Radiation Oncology         [phone] ________________________________  Name: Kelly Yu MRN: [id number]  Date: 03/06/2016  DOB: [birth year]  CC:No PCP Per Patient  Rolm Bookbinder, MD     REFERRING PHYSICIAN: Rolm Bookbinder, MD   DIAGNOSIS: The encounter diagnosis was Malignant neoplasm of upper-outer quadrant of right female breast, unspecified estrogen receptor status ([facility]).   HISTORY OF PRESENT ILLNESS: Kelly Yu is a 60 y.o. female seen at the request of Dr. Donne Hazel for a recent diagnosis of right breast cancer. The patient is from [place] and noted a palpable right breast mass. She underwent lumpectomy for this in July 2017 in her home country. Shortly after surgery she traveled to the [place], and had developed a surgical site infection and presented to [facility] ED. She was treated with antibiotics with Dr. Donne Hazel who went to great lengths to have her pathology report obtained and translated. 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 Hormone receptor studies were either not performed or unavailable despite multiple attempts to communicate with the pathology department in [place]. She has undergone Mammogram and Ultrasound in September 2017, the ultrasound revealed a 5.7 cm fluid collection in the upper outer quadrant of the breast, as well as a borderline lymph node. Biopsies of both sites did not reveal malignant change. Dr. Donne Hazel does not feel that there is a strong role for additional lymph node sampling, and has referred the patient for discussion of the role of radiotherapy at this point with Dr. Lisbeth Renshaw. She has plans to meet with Dr. Jana Hakim as well next Thursday to discuss whether or not there is a role for additional estrogen blockade or systemic treatment.    PREVIOUS RADIATION THERAPY: No   PAST MEDICAL HISTORY:  Past Medical History:  Diagnosis Date  . Breast cancer ([facility])    Diagnosed in [place] in July  2017, Stage IA, T1c, Nx, Mx invasive ductal carcinoma  . Ulcer ([place])        PAST SURGICAL HISTORY: Past Surgical History:  Procedure Laterality Date  . right breast lumpectomy       FAMILY HISTORY:  Family History  Problem Relation Age of Onset  . Diabetes Sister      SOCIAL HISTORY:  reports that she has never smoked. She has never used smokeless tobacco. She reports that she does not drink alcohol or use drugs. The patient is widowed and resides in [place] with her daughter, but travels back to her home country of [place] regularly. She has 8 children.    ALLERGIES: Review of patient's allergies indicates no known allergies.   MEDICATIONS:  No current outpatient prescriptions on file.   No current facility-administered medications for this encounter.      REVIEW OF SYSTEMS: On review of systems, the patient reports that she is doing well overall since her breast infection was treated. She denies any chest pain, shortness of breath, cough, fevers, chills, night sweats, unintended weight changes. She denies any bowel or bladder disturbances, and denies abdominal pain, nausea or vomiting. She denies any new musculoskeletal or joint aches or pains. A 10 point complete review of systems is obtained and is otherwise negative.     PHYSICAL EXAM:  Wt Readings from Last 3 Encounters:  03/06/16 154 lb (69.9 kg)  01/18/16 150 lb (68 kg)   Temp Readings from Last 3 Encounters:  03/06/16 97.9 F (36.6 C) (Oral)  01/18/16 98.2 F (36.8 C) (Oral)  12/22/15  97.7 F (36.5 C) (Oral)   BP Readings from Last 3 Encounters:  03/06/16 (!) 164/83  01/18/16 (!) 160/92  12/22/15 131/81   Pulse Readings from Last 3 Encounters:  03/06/16 78  12/22/15 79  11/13/12 64     Pain scale 0/10 In general this is a well appearing middle [place] female in no acute distress. She is alert and oriented x4 and appropriate throughout the examination. HEENT reveals that the patient is  normocephalic, atraumatic. EOMs are intact. PERRLA. Skin is intact without any evidence of gross lesions. Cardiovascular exam reveals a regular rate and rhythm, no clicks rubs or murmurs are auscultated. Chest is clear to auscultation bilaterally. Lymphatic assessment is performed and does not reveal any adenopathy in the cervical, supraclavicular, axillary, or inguinal chains. The right breast reveals a well healed scar of hte upper outer quadrant of the right breast. Induration deep to the incision is noted without fluctuance. No erythema or skin separation is noted. Abdomen has active bowel sounds in all quadrants and is intact. The abdomen is soft, non tender, non distended. Lower extremities are negative for pretibial pitting edema, deep calf tenderness, cyanosis or clubbing.   ECOG = 0  0 - Asymptomatic (Fully active, able to carry on all predisease activities without restriction)  1 - Symptomatic but completely ambulatory (Restricted in physically strenuous activity but ambulatory and able to carry out work of a light or sedentary nature. For example, light housework, office work)  2 - Symptomatic, <50% in bed during the day (Ambulatory and capable of all self care but unable to carry out any work activities. Up and about more than 50% of waking hours)  3 - Symptomatic, >50% in bed, but not bedbound (Capable of only limited self-care, confined to bed or chair 50% or more of waking hours)  4 - Bedbound (Completely disabled. Cannot carry on any self-care. Totally confined to bed or chair)  5 - Death   Eustace Pen MM, Creech RH, Tormey DC, et al. [phone]). "Toxicity and response criteria of the [facility]". [place] Oncol. 5 (6): 649-55    LABORATORY DATA:  Lab Results  Component Value Date   WBC 6.6 12/22/2015   HGB 11.1 (L) 12/22/2015   HCT 34.2 (L) 12/22/2015   MCV 88.6 12/22/2015   PLT 188 12/22/2015   Lab Results  Component Value Date   NA 141 12/22/2015    K 4.2 12/22/2015   CL 105 12/22/2015   CO2 30 12/22/2015   No results found for: ALT, AST, GGT, ALKPHOS, BILITOT    RADIOGRAPHY: No results found.     IMPRESSION/PLAN: 1. Stage IA, T1b, N0, Mx invasive ductal carcinoma of the right breast. Dr. Lisbeth Renshaw reviews the findings we have to date from her pathology reports. We discussed that in patients who undergo breast conservation, that after completely healing, recommendations would include external radiotherapy to the breast to reduce the risk of recurrence and treat any microscopic disease that remains. We do not know her hormone receptor status, and she will meet with Dr. Jana Hakim next Thursday to discuss his thoughts on the use of antiestrogen therapy and whether there is any role for other systemic agents. We discussed the risks, benefits, short, and long term effects of radiotherapy, and the patient is interested in proceeding. Dr. Lisbeth Renshaw recommends a course of 20 fractions over 4 weeks if she does not proceed with any chemotherapy. We will tentatively schedule her appointment for simulation next Thursday before she meets  with Dr. Jana Hakim to accommodate her needs for appointment times and her daughter's work schedule. The patient and her daughter do understand however that if Dr. Jana Hakim recommended systemic therapy, we would hold off on radiation until she finished such treatment. We have reviewed consent, and she has signed written consent to move forward.   The above documentation reflects my direct findings during this shared patient visit. Please see the separate note by Dr. Lisbeth Renshaw on this date for the remainder of the patient's plan of care.    Carola Rhine, PAC

## 2016-03-12 ENCOUNTER — Other Ambulatory Visit: Payer: Self-pay | Admitting: *Deleted

## 2016-03-13 ENCOUNTER — Ambulatory Visit (HOSPITAL_BASED_OUTPATIENT_CLINIC_OR_DEPARTMENT_OTHER): Payer: Self-pay | Admitting: Oncology

## 2016-03-13 ENCOUNTER — Other Ambulatory Visit: Payer: Self-pay | Admitting: *Deleted

## 2016-03-13 ENCOUNTER — Other Ambulatory Visit (HOSPITAL_BASED_OUTPATIENT_CLINIC_OR_DEPARTMENT_OTHER): Payer: Self-pay

## 2016-03-13 ENCOUNTER — Ambulatory Visit
Admission: RE | Admit: 2016-03-13 | Discharge: 2016-03-13 | Disposition: A | Discharge status: Home / Self Care | Payer: Self-pay | Source: Ambulatory Visit | Attending: Radiation Oncology | Admitting: Radiation Oncology

## 2016-03-13 DIAGNOSIS — C50411 Malignant neoplasm of upper-outer quadrant of right female breast: Secondary | ICD-10-CM

## 2016-03-13 DIAGNOSIS — Z170 Estrogen receptor positive status [ER+]: Secondary | ICD-10-CM

## 2016-03-13 LAB — CBC WITH DIFFERENTIAL/PLATELET
BASO%: 0.4 % (ref 0.0–2.0)
Basophils Absolute: 0 10e3/uL (ref 0.0–0.1)
EOS%: 2.4 % (ref 0.0–7.0)
Eosinophils Absolute: 0.2 10e3/uL (ref 0.0–0.5)
HCT: 36.1 % (ref 34.8–46.6)
HGB: 12.2 g/dL (ref 11.6–15.9)
LYMPH%: 36.5 % (ref 14.0–49.7)
MCH: 29.5 pg (ref 25.1–34.0)
MCHC: 33.8 g/dL (ref 31.5–36.0)
MCV: 87.2 fL (ref 79.5–101.0)
MONO#: 0.6 10e3/uL (ref 0.1–0.9)
MONO%: 7.4 % (ref 0.0–14.0)
NEUT#: 4.3 10e3/uL (ref 1.5–6.5)
NEUT%: 53.3 % (ref 38.4–76.8)
Platelets: 182 10e3/uL (ref 145–400)
RBC: 4.14 10e6/uL (ref 3.70–5.45)
RDW: 13.8 % (ref 11.2–14.5)
WBC: 8.1 10e3/uL (ref 3.9–10.3)
lymph#: 2.9 10e3/uL (ref 0.9–3.3)

## 2016-03-13 LAB — COMPREHENSIVE METABOLIC PANEL
ALBUMIN: 3.8 g/dL (ref 3.5–5.0)
ALT: 10 U/L (ref 0–55)
ANION GAP: 9 meq/L (ref 3–11)
AST: 19 U/L (ref 5–34)
Alkaline Phosphatase: 121 U/L (ref 40–150)
BILIRUBIN TOTAL: 0.37 mg/dL (ref 0.20–1.20)
BUN: 14.4 mg/dL (ref 7.0–26.0)
CALCIUM: 9.2 mg/dL (ref 8.4–10.4)
CO2: 25 mEq/L (ref 22–29)
CREATININE: 0.8 mg/dL (ref 0.6–1.1)
Chloride: 108 mEq/L (ref 98–109)
EGFR: 84 mL/min/{1.73_m2} — ABNORMAL LOW (ref >90)
Glucose: 137 mg/dl (ref 70–140)
Potassium: 4.3 mEq/L (ref 3.5–5.1)
Sodium: 142 mEq/L (ref 136–145)
TOTAL PROTEIN: 7.8 g/dL (ref 6.4–8.3)

## 2016-03-13 MED ORDER — ANASTROZOLE 1 MG PO TABS: 1.0000 mg | ORAL_TABLET | Freq: Every day | ORAL | 4 refills | Status: DC

## 2016-03-13 NOTE — Progress Notes (Signed)
Her  I'll plan will be  [name]  Telephone:(336) [phone] Fax:([phone]) [phone]     ID: Kelly Yu DOB: 1956/03/13  MR#: [id number]  WUX#:[id number]  Patient Care Team: No Pcp Per Patient as PCP - General (General Practice) Chauncey Cruel, MD as Consulting Physician (Oncology) Rolm Bookbinder, MD as Consulting Physician (General Surgery) Kyung Rudd, MD as Consulting Physician (Radiation Oncology) Chauncey Cruel, MD OTHER MD:  CHIEF COMPLAINT: Invasive ductal carcinoma, unknown prognostic panel  CURRENT TREATMENT: Radiation, anastrozole   BREAST CANCER HISTORY: Kelly Yu tells me she had pain in the right breast, which brought her to a physician in [place], where she normally resides. They obtained a mammogram, but she does not know those results. 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 There is no prognostic panel  More recently, while visiting family in [place], the patient presented to the emergency department with a complaint of right breast pain and redness. She was afebrile. Needle aspiration was obtained and the patient was started on Augmentin. The aspirate cultures remain negative.  On 12/22/2015 she had a right breast ultrasound at the [facility]. This showed, at the 11:00 position of the right breast, 8 cm from the nipple, a large complex cystic region measuring up to 8.5 cm. There was no hyperemia.   The patient was referred to surgery, and on 01/18/2016 she had bilateral diagnostic mammography with tomography [facility] The breast density was category B. In the upper outer right breast there was a 5.7 cm mass, with an associated 1 cm satellite. There was a large firm mass deforming the  contour of the breast and targeted ultrasound demonstrated a large complex collection in the upper outer quadrant of the breast measuring up to 4.8 cm. A borderline lymph node with a moderately thickened cortex was visualized in the right axilla.  Biopsy of the right breast mass and right axillary lymph node 01/25/2016 showed ([id number]) no evidence of malignancy. Cultures from the 01/25/2016 biopsy all remained negative.  The was referred to radiation oncology and is currently receiving adjuvant radiation. Her subsequent history is as detailed below  INTERVAL HISTORY: Kelly Yu was evaluated in the breast clinic 03/13/2016 accompanied by her daughter [name] and an interpreter.  REVIEW OF SYSTEMS: The patient currently denies fever, significant right breast pain, right breast redness or discharge. She admits to some tenderness in the right breast area associated with her surgery. She has occasional headaches. There are no more frequent than every few days and she treats them with Tylenol. There are not associated with visual changes, nausea, vomiting, dizziness, or gait imbalance. She denies cough, phlegm production, pleurisy, shortness of breath or any change in bowel or bladder habits. Sometimes she has a sharp pain in the right lower quadrant of the abdomen, but this is usually brief and not associated with change in bowel or bladder habits. A detailed review of systems today was otherwise noncontributory  PAST MEDICAL HISTORY: Past Medical History:  Diagnosis Date  . Breast cancer ([facility])    Diagnosed in [place] in July 2017, Stage IA, T1c, Nx, Mx invasive ductal carcinoma  . Ulcer ([facility])     PAST SURGICAL HISTORY: Past Surgical History:  Procedure Laterality Date  . right breast lumpectomy      FAMILY  HISTORY Family History  Problem Relation Age of Onset  . Diabetes Sister   The patient's father died from renal failure in his 26s the patient's mother died from a blood clot in her 62s  The patient had 4 brothers, 8 sisters. There is no history of breast or ovarian cancer in the family to the patient's knowledge  GYNECOLOGIC HISTORY:  No LMP recorded. Patient is postmenopausal. Menarche age 8, first live birth age 6, the patient is GX B8. She is now postmenopausal. She never took hormone replacement.  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 While visiting in the [place] she lives with her daughter [name]. 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 Also at home are the patient's son-in-law [name] , who works for [employer], and their 3 children age 42, 77, and 62    ADVANCED DIRECTIVES: The patient's daughter [name] and son-in-law [name] are jointly the patient's healthcare power of attorney    HEALTH MAINTENANCE: Social History  Substance Use Topics  . Smoking status: Never Smoker  . Smokeless tobacco: Never Used  . Alcohol use No     Colonoscopy:  PAP: 01/18/2016 / benign   Bone density:   No Known Allergies  Current Outpatient Prescriptions  Medication Sig Dispense Refill  . anastrozole (ARIMIDEX) 1 MG tablet Take 1 tablet (1 mg total) by mouth daily. 90 tablet 4   No current facility-administered medications for this visit.     OBJECTIVE: Middle-aged middle [place] woman in no acute distress  Vitals:   03/13/16 1616  BP: (!) 153/73  Pulse: 73  Resp: 18  Temp: 98.5 F (36.9 C)     Body mass index is 27.12 kg/m.    ECOG FS:1 - Symptomatic but completely ambulatory  Ocular: Sclerae unicteric, pupils round and equal, EOMs intact  Ear-nose-throat: Oropharynx clear and moist, good dentition  Lymphatic: No cervical or supraclavicular adenopathy Lungs no rales or rhonchi, good excursion bilaterally Heart regular rate and rhythm, no murmur appreciated Abd soft, nontender, positive bowel sounds, no right lower quadrant tenderness or mass noted  MSK no focal spinal tenderness, no joint edema Neuro:  non-focal, well-oriented, appropriate affect Breasts:  the right breast is status post prior lumpectomy. There is significant induration associated with the scar. There is no evidence of local recurrence. There is no evidence of any right axillary surgery in the right axilla is benign. The left breast is unremarkable area   LAB RESULTS:  CMP     Component Value Date/Time   NA 142 03/13/2016 1601   K 4.3 03/13/2016 1601   CL 105 12/22/2015 1628   CO2 25 03/13/2016 1601   GLUCOSE 137 03/13/2016 1601   BUN 14.4 03/13/2016 1601   CREATININE 0.8 03/13/2016 1601   CALCIUM 9.2 03/13/2016 1601   PROT 7.8 03/13/2016 1601   ALBUMIN 3.8 03/13/2016 1601   AST 19 03/13/2016 1601   ALT 10 03/13/2016 1601   ALKPHOS 121 03/13/2016 1601   BILITOT 0.37 03/13/2016 1601   GFRNONAA >60 12/22/2015 1628   GFRAA >60 12/22/2015 1628    INo results found for: SPEP, UPEP  Lab Results  Component Value Date   WBC 8.1 03/13/2016   NEUTROABS 4.3 03/13/2016   HGB 12.2 03/13/2016   HCT 36.1 03/13/2016   MCV 87.2 03/13/2016   PLT 182 03/13/2016      Chemistry      Component Value Date/Time   NA 142  03/13/2016 1601   K 4.3 03/13/2016 1601   CL 105 12/22/2015 1628   CO2 25 03/13/2016 1601   BUN 14.4 03/13/2016 1601   CREATININE 0.8 03/13/2016 1601      Component Value Date/Time   CALCIUM 9.2 03/13/2016 1601   ALKPHOS 121 03/13/2016 1601   AST 19 03/13/2016 1601   ALT 10 03/13/2016 1601   BILITOT 0.37 03/13/2016 1601       No results found for: LABCA2  No components found for: LABCA125  No results for input(s): INR in the last 168 hours.  Urinalysis    Component Value Date/Time   COLORURINE YELLOW 11/13/2012 1059   APPEARANCEUR CLEAR 11/13/2012 1059   LABSPEC 1.010 11/13/2012 1059   PHURINE 8.5 (H) 11/13/2012 1059   GLUCOSEU NEGATIVE 11/13/2012 1059   HGBUR NEGATIVE 11/13/2012 1059   BILIRUBINUR NEGATIVE 11/13/2012 1059   KETONESUR NEGATIVE 11/13/2012 1059   PROTEINUR NEGATIVE  11/13/2012 1059   UROBILINOGEN 0.2 11/13/2012 1059   NITRITE NEGATIVE 11/13/2012 1059   LEUKOCYTESUR TRACE (A) 11/13/2012 1059     STUDIES: No results found.  ELIGIBLE FOR AVAILABLE RESEARCH PROTOCOL: No  ASSESSMENT: 60 y.o. [place] Woman, originally from [place], status post right breast upper outer quadrant lumpectomy July 2017 for a pT1c pNX, stage IA invasive ductal carcinoma,  grade 3, prognostic panel not available  (1) complex cystic mass in the right breast and suspicious right axillary lymph node both biopsied 01/25/2016, showing no evidence of malignancy. Multiple cultures were negative as well   (2) adjuvant radiation to be completed 05/14/2015  (3) to start anastrozole 03/13/2016   PLAN: We spent the better part of today's hour-long appointment discussing the biology of breast cancer in general, and the specifics of the patient's tumor in particular. We first reviewed the fact that cancer is not one disease but more than 100 different diseases and that it is important to keep them separate-- otherwise when friends and relatives discuss their own cancer experiences with [facility] confusion can result. Similarly we explained that if breast cancer spreads to the bone or liver, the patient would not have bone cancer or liver cancer, but breast cancer in the bone and breast cancer in the liver: one cancer in three places-- not 3 different cancers which otherwise would have to be treated in 3 different ways.  We discussed the difference between local and systemic therapy. In terms of loco-regional treatment, lumpectomy plus radiation is equivalent to mastectomy as far as survival is concerned. She has already had her lumpectomy (without sentinel lymph node sampling) and is now ready to start adjuvant radiation, which she is scheduled to receive between 03/24/2016 and 05/14/2015  Next we went over the options for systemic therapy which are anti-estrogens, anti-HER-2 immunotherapy, and  chemotherapy. Unfortunately Keylah did not have a prognostic panel obtained on the surgery she underwent July of this year in [place], or at least we do not have any record of 1. Just working from [employer], about 70% of breast cancers are estrogen receptor positive at only about 20% or HER-2 positive. Accordingly it would make sense to treat her with anti-estrogens but not with anti-HER-2 treatment.  Given that we are dealing with a stage I tumor, although poorly differentiated, we would not proceed to chemotherapy in this case without Oncotype guidance. Unfortunately that is not available and so we are bypassing chemotherapy. In any case, the benefits of chemotherapy 4 months after definitive surgery are poorly documented  Accordingly the plan is to start  anastrozole now and continue that for a total of 5 years. I do not see indication for formal staging studies so these will not be obtained. She will see me again in January at the completion of her radiation treatments, but I have urged her to call us with any other problems that she may have before that visit.  The patient has a good understanding of the overall plan. She agrees with it. She knows the goal of treatment in her case is cure. She will call with any problems that may develop before her next visit here.  Chauncey Cruel, MD   03/13/2016 5:28 PM Medical Oncology and Hematology [facility] [address] [place], [place] [address] Tel. [phone]    Fax. [phone]

## 2016-03-18 ENCOUNTER — Telehealth: Payer: Self-pay | Admitting: Radiation Oncology

## 2016-03-18 NOTE — Telephone Encounter (Signed)
I spoke with the patient's son in law to explain that I'd like to speak with his wife, the patient's daughter to review Dr. Ida Rogue recommendations to change her mother's treatment from 4 weeks to 6 1/2 weeks. She will return my call.

## 2016-03-19 ENCOUNTER — Telehealth: Payer: Self-pay | Admitting: Oncology

## 2016-03-19 NOTE — Telephone Encounter (Signed)
Left message re lab/fu 05/13/16. Schedule mailed.

## 2016-03-20 ENCOUNTER — Ambulatory Visit
Admission: RE | Admit: 2016-03-20 | Discharge: 2016-03-20 | Disposition: A | Discharge status: Home / Self Care | Payer: Self-pay | Source: Ambulatory Visit | Attending: Radiation Oncology | Admitting: Radiation Oncology

## 2016-03-21 ENCOUNTER — Ambulatory Visit: Payer: Self-pay

## 2016-03-23 ENCOUNTER — Ambulatory Visit
Admission: RE | Admit: 2016-03-23 | Discharge: 2016-03-23 | Disposition: A | Discharge status: Home / Self Care | Payer: Self-pay | Source: Ambulatory Visit | Attending: Radiation Oncology | Admitting: Radiation Oncology

## 2016-03-23 ENCOUNTER — Ambulatory Visit: Payer: Self-pay

## 2016-03-23 DIAGNOSIS — C50411 Malignant neoplasm of upper-outer quadrant of right female breast: Secondary | ICD-10-CM

## 2016-03-23 MED ORDER — RADIAPLEXRX EX GEL
Freq: Once | CUTANEOUS | Status: AC
  Administered 2016-03-23: 09:00:00 via TOPICAL

## 2016-03-23 MED ORDER — ALRA NON-METALLIC DEODORANT (RAD-ONC)
1.0000 "application " | Freq: Once | TOPICAL | Status: AC
  Administered 2016-03-23: 1 via TOPICAL

## 2016-03-23 NOTE — Progress Notes (Addendum)
Post sim education done, radiation therapy and you book ,my business card, alra deodorant, radiaplex gel given to patient, discussed skin irritation, fatigue, swelling/tenderness of breast, use radiaplex daily after rad tx and at bedtime, use of electric razor if shaving, no underwire bra, increase protein in diet, stay hydrated,drink plenty water,  Interpreter  Harriet Pho and daughter with patient, , verbal understanding, teach back given

## 2016-03-24 ENCOUNTER — Ambulatory Visit: Payer: Self-pay

## 2016-03-24 ENCOUNTER — Ambulatory Visit
Admission: RE | Admit: 2016-03-24 | Discharge: 2016-03-24 | Disposition: A | Discharge status: Home / Self Care | Payer: Self-pay | Source: Ambulatory Visit | Attending: Radiation Oncology | Admitting: Radiation Oncology

## 2016-03-25 ENCOUNTER — Ambulatory Visit: Payer: Self-pay

## 2016-03-25 ENCOUNTER — Ambulatory Visit
Admission: RE | Admit: 2016-03-25 | Discharge: 2016-03-25 | Disposition: A | Discharge status: Home / Self Care | Payer: Self-pay | Source: Ambulatory Visit | Attending: Radiation Oncology | Admitting: Radiation Oncology

## 2016-03-26 ENCOUNTER — Ambulatory Visit
Admission: RE | Admit: 2016-03-26 | Discharge: 2016-03-26 | Disposition: A | Discharge status: Home / Self Care | Payer: Self-pay | Source: Ambulatory Visit | Attending: Radiation Oncology | Admitting: Radiation Oncology

## 2016-03-26 ENCOUNTER — Ambulatory Visit: Payer: Self-pay

## 2016-03-28 ENCOUNTER — Ambulatory Visit: Payer: Self-pay

## 2016-03-31 ENCOUNTER — Encounter: Payer: Self-pay | Admitting: Radiation Oncology

## 2016-03-31 ENCOUNTER — Ambulatory Visit
Admission: RE | Admit: 2016-03-31 | Discharge: 2016-03-31 | Disposition: A | Discharge status: Home / Self Care | Payer: Self-pay | Source: Ambulatory Visit | Attending: Radiation Oncology | Admitting: Radiation Oncology

## 2016-03-31 VITALS — BP 143/91 | HR 70 | Temp 98.5°F | Resp 20 | Wt 153.0 lb

## 2016-03-31 DIAGNOSIS — C50411 Malignant neoplasm of upper-outer quadrant of right female breast: Secondary | ICD-10-CM

## 2016-03-31 NOTE — Progress Notes (Signed)
Weekly rad txs 5/ 28  Right breast completed,   C/o not feeling well, stated no skin changes, slight discomfort in breast, using radiaplex bid,  interrater Milbert Coulter in with patient 3:50 PM BP (!) 143/91 (BP Location: Left Arm, Patient Position: Sitting, Cuff Size: Normal)   Pulse 70   Temp 98.5 F (36.9 C) (Oral)   Resp 20   Wt 153 lb (69.4 kg)   BMI 27.10 kg/m   Wt Readings from Last 3 Encounters:  03/31/16 153 lb (69.4 kg)  03/13/16 153 lb 1.6 oz (69.4 kg)  03/06/16 154 lb (69.9 kg)

## 2016-03-31 NOTE — Progress Notes (Signed)
   Department of Radiation Oncology  Phone:  [phone] Fax:        [phone]  Weekly Treatment Note    Name: Kelly Yu Date: 03/31/2016 MRN: [id number] DOB: 12/26/1955   Diagnosis:     ICD-9-CM ICD-10-CM   1. Primary cancer of upper outer quadrant of right female breast (HCC) 174.4 C50.411      Current dose: 9 Gy  Current fraction: 5   MEDICATIONS: Current Outpatient Prescriptions  Medication Sig Dispense Refill  . hyaluronate sodium (RADIAPLEXRX) GEL Apply 1 application topically once.    . non-metallic deodorant Jethro Poling) MISC Apply 1 application topically daily as needed.    Marland Kitchen anastrozole (ARIMIDEX) 1 MG tablet Take 1 tablet (1 mg total) by mouth daily. (Patient not taking: Reported on 03/31/2016) 90 tablet 4   No current facility-administered medications for this encounter.      ALLERGIES: Patient has no known allergies.   LABORATORY DATA:  Lab Results  Component Value Date   WBC 8.1 03/13/2016   HGB 12.2 03/13/2016   HCT 36.1 03/13/2016   MCV 87.2 03/13/2016   PLT 182 03/13/2016   Lab Results  Component Value Date   NA 142 03/13/2016   K 4.3 03/13/2016   CL 105 12/22/2015   CO2 25 03/13/2016   Lab Results  Component Value Date   ALT 10 03/13/2016   AST 19 03/13/2016   ALKPHOS 121 03/13/2016   BILITOT 0.37 03/13/2016     NARRATIVE: Kelly Yu was seen today for weekly treatment management. The chart was checked and the patient's films were reviewed.  The patient has completed 5 out of 28 fractions to her right breast. She reports "not feeling well." She denies any skin changes, though she notes a slight discomfort in her breast. The patient is using radiaplex bid as directed. The patient is accompanied by interpreter Milbert Coulter today.  PHYSICAL EXAMINATION: weight is 153 lb (69.4 kg). Her oral temperature is 98.5 F (36.9 C). Her blood pressure is 143/91 (abnormal) and her pulse is 70. Her respiration is 20.     ASSESSMENT: The  patient is doing satisfactorily with treatment.  PLAN: We will continue with the patient's radiation treatment as planned. I recommend taking Tylenol or Advil if needed for minor pains.  ------------------------------------------------  Jodelle Gross, MD, PhD  This document serves as a record of services personally performed by Kyung Rudd, MD. It was created on his behalf by Maryla Morrow, a trained medical scribe. The creation of this record is based on the scribe's personal observations and the provider's statements to them. This document has been checked and approved by the attending provider.

## 2016-04-01 ENCOUNTER — Ambulatory Visit
Admission: RE | Admit: 2016-04-01 | Discharge: 2016-04-01 | Disposition: A | Discharge status: Home / Self Care | Payer: Self-pay | Source: Ambulatory Visit | Attending: Radiation Oncology | Admitting: Radiation Oncology

## 2016-04-02 ENCOUNTER — Ambulatory Visit
Admission: RE | Admit: 2016-04-02 | Discharge: 2016-04-02 | Disposition: A | Discharge status: Home / Self Care | Payer: Self-pay | Source: Ambulatory Visit | Attending: Radiation Oncology | Admitting: Radiation Oncology

## 2016-04-03 ENCOUNTER — Ambulatory Visit
Admission: RE | Admit: 2016-04-03 | Discharge: 2016-04-03 | Disposition: A | Discharge status: Home / Self Care | Payer: Self-pay | Source: Ambulatory Visit | Attending: Radiation Oncology | Admitting: Radiation Oncology

## 2016-04-04 ENCOUNTER — Ambulatory Visit
Admission: RE | Admit: 2016-04-04 | Discharge: 2016-04-04 | Disposition: A | Discharge status: Home / Self Care | Payer: Self-pay | Source: Ambulatory Visit | Attending: Radiation Oncology | Admitting: Radiation Oncology

## 2016-04-07 ENCOUNTER — Ambulatory Visit
Admission: RE | Admit: 2016-04-07 | Discharge: 2016-04-07 | Disposition: A | Discharge status: Home / Self Care | Payer: No Typology Code available for payment source | Source: Ambulatory Visit | Attending: Radiation Oncology | Admitting: Radiation Oncology

## 2016-04-07 ENCOUNTER — Encounter: Payer: Self-pay | Admitting: Radiation Oncology

## 2016-04-07 ENCOUNTER — Ambulatory Visit
Admission: RE | Admit: 2016-04-07 | Discharge: 2016-04-07 | Disposition: A | Discharge status: Home / Self Care | Payer: Self-pay | Source: Ambulatory Visit | Attending: Radiation Oncology | Admitting: Radiation Oncology

## 2016-04-07 VITALS — BP 149/95 | HR 64 | Resp 16 | Wt 155.6 lb

## 2016-04-07 DIAGNOSIS — C50411 Malignant neoplasm of upper-outer quadrant of right female breast: Secondary | ICD-10-CM | POA: Insufficient documentation

## 2016-04-07 DIAGNOSIS — Z510 Encounter for antineoplastic radiation therapy: Secondary | ICD-10-CM | POA: Insufficient documentation

## 2016-04-07 MED ORDER — RADIAPLEXRX EX GEL
Freq: Once | CUTANEOUS | Status: AC
  Administered 2016-04-07: 17:00:00 via TOPICAL

## 2016-04-07 NOTE — Progress Notes (Signed)
[place] patient at end of block. Interpretor present. Weight stable. BP slightly elevated. Encouraged follow up with PCP since this has been a persistent issue. Discoloration just outside nipple area of right breast likely related to edema. Patient confirms her right breast feel heavy. Right breast does not feel fevered. No hyperpigmentation or desquamation of right/treated breast noted. Reports using radiaplex and alra as directed. Reports occasional sharp shooting pain in her right breast but, understands now those are related to effects of surgery. Provided patient with another tube of radiaplex. No evidence of lymphedema noted in right arm.  BP (!) 149/95 (BP Location: Left Arm, Patient Position: Sitting, Cuff Size: Normal)   Pulse 64   Resp 16   Wt 155 lb 9.6 oz (70.6 kg)   SpO2 100%   BMI 27.56 kg/m  Wt Readings from Last 3 Encounters:  04/07/16 155 lb 9.6 oz (70.6 kg)  03/31/16 153 lb (69.4 kg)  03/13/16 153 lb 1.6 oz (69.4 kg)

## 2016-04-07 NOTE — Progress Notes (Signed)
  Radiation Oncology         [phone] ________________________________  Name: Kelly Yu MRN: [id number]  Date: 04/07/2016  DOB: [birth year]   Weekly Radiation Therapy Management    ICD-9-CM ICD-10-CM   1. 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 . . . . . . . The patient presents for routine under treatment assessment.                                  The patient reports an occasional sharp pain. The patient's BP is slightly elevated and the nurse advised the patient to follow up with her PCP for this issue. Discoloration just outside nipple area of right breast likely related to edema. Patient confirms her right breast feel heavy. Right breast does not feel fevered. No hyperpigmentation or desquamation of right/treated breast noted. Reports using radiaplex and alra as directed. Reports occasional sharp shooting pain in her right breast, but understands now those are related to the eeffects of surgery. The nurse provided the patient with another tube of radiaplex. No evidence of lymphedema noted in the right arm. An interpreter was present during this encounter.                                  Set-up films were reviewed.                                 The chart was checked. Physical Findings. . .  weight is 155 lb 9.6 oz (70.6 kg). Her blood pressure is 149/95 (abnormal) and her pulse is 64. Her respiration is 16 and oxygen saturation is 100%. . Lungs clear, heart regular rhythm and rate.  Some slight hyperpigmentation changes to the right breast And mild edema. Impression . . . . . . . The patient is tolerating radiation. Plan . . . . . . . . . . . . Continue treatment as planned.  ________________________________   Blair Promise, PhD, MD  This document serves as a record of services personally performed by Gery Pray, MD. It was  created on his behalf by Darcus Austin, a trained medical scribe. The creation of this record is based on the scribe's personal observations and the provider's statements to them. This document has been checked and approved by the attending provider.

## 2016-04-08 ENCOUNTER — Ambulatory Visit
Admission: RE | Admit: 2016-04-08 | Discharge: 2016-04-08 | Disposition: A | Discharge status: Home / Self Care | Payer: Self-pay | Source: Ambulatory Visit | Attending: Radiation Oncology | Admitting: Radiation Oncology

## 2016-04-09 ENCOUNTER — Ambulatory Visit
Admission: RE | Admit: 2016-04-09 | Discharge: 2016-04-09 | Disposition: A | Discharge status: Home / Self Care | Payer: Self-pay | Source: Ambulatory Visit | Attending: Radiation Oncology | Admitting: Radiation Oncology

## 2016-04-10 ENCOUNTER — Ambulatory Visit
Admission: RE | Admit: 2016-04-10 | Discharge: 2016-04-10 | Disposition: A | Discharge status: Home / Self Care | Payer: Self-pay | Source: Ambulatory Visit | Attending: Radiation Oncology | Admitting: Radiation Oncology

## 2016-04-11 ENCOUNTER — Encounter: Payer: Self-pay | Admitting: Radiation Oncology

## 2016-04-11 ENCOUNTER — Ambulatory Visit
Admission: RE | Admit: 2016-04-11 | Discharge: 2016-04-11 | Disposition: A | Discharge status: Home / Self Care | Payer: No Typology Code available for payment source | Source: Ambulatory Visit | Attending: Radiation Oncology | Admitting: Radiation Oncology

## 2016-04-11 ENCOUNTER — Ambulatory Visit
Admission: RE | Admit: 2016-04-11 | Discharge: 2016-04-11 | Disposition: A | Discharge status: Home / Self Care | Payer: Self-pay | Source: Ambulatory Visit | Attending: Radiation Oncology | Admitting: Radiation Oncology

## 2016-04-11 VITALS — BP 155/87 | HR 74 | Temp 97.6°F | Resp 16 | Wt 154.6 lb

## 2016-04-11 DIAGNOSIS — C50411 Malignant neoplasm of upper-outer quadrant of right female breast: Secondary | ICD-10-CM

## 2016-04-11 NOTE — Progress Notes (Signed)
   Department of Radiation Oncology  Phone:  [phone] Fax:        [phone]  Weekly Treatment Note    Name: Kelly Yu Date: 04/11/2016 MRN: [id number] DOB: Sep 11, 1955   Diagnosis:     ICD-9-CM ICD-10-CM   1. 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 hyaluronate sodium (RADIAPLEXRX) GEL Apply 1 application topically once.    . non-metallic deodorant Jethro Poling) MISC Apply 1 application topically daily as needed.    Marland Kitchen anastrozole (ARIMIDEX) 1 MG tablet Take 1 tablet (1 mg total) by mouth daily. (Patient not taking: Reported on 04/11/2016) 90 tablet 4   No current facility-administered medications for this encounter.      ALLERGIES: Patient has no known allergies.   LABORATORY DATA:  Lab Results  Component Value Date   WBC 8.1 03/13/2016   HGB 12.2 03/13/2016   HCT 36.1 03/13/2016   MCV 87.2 03/13/2016   PLT 182 03/13/2016   Lab Results  Component Value Date   NA 142 03/13/2016   K 4.3 03/13/2016   CL 105 12/22/2015   CO2 25 03/13/2016   Lab Results  Component Value Date   ALT 10 03/13/2016   AST 19 03/13/2016   ALKPHOS 121 03/13/2016   BILITOT 0.37 03/13/2016     NARRATIVE: Kelly Yu was seen today for weekly treatment management. The chart was checked and the patient's films were reviewed.  Weekly rad txs right breast, mild erythema,skinintact,  Using radiaplex bid, appetite good,  Mild fatigue, slight pain in breast not much stated 4:18 PM BP (!) 155/87 (BP Location: Left Arm, Patient Position: Sitting, Cuff Size: Normal)   Pulse 74   Temp 97.6 F (36.4 C) (Oral)   Resp 16   Wt 154 lb 9.6 oz (70.1 kg)   BMI 27.39 kg/m   Wt Readings from Last 3 Encounters:  04/11/16 154 lb 9.6 oz (70.1 kg)  04/07/16 155 lb 9.6 oz (70.6 kg)  03/31/16 153 lb (69.4 kg)     PHYSICAL EXAMINATION: weight is 154 lb 9.6 oz (70.1 kg). Her oral temperature is 97.6 F (36.4 C). Her blood pressure is 155/87 (abnormal) and her pulse is 74. Her respiration is 16.        ASSESSMENT: The patient is doing satisfactorily with treatment.  PLAN: We will continue with the patient's radiation treatment as planned.

## 2016-04-11 NOTE — Progress Notes (Signed)
Weekly rad txs right breast, mild erythema,skinintact,  Using radiaplex bid, appetite good,  Mild fatigue, slight pain in breast not much stated 4:09 PM BP (!) 155/87 (BP Location: Left Arm, Patient Position: Sitting, Cuff Size: Normal)   Pulse 74   Temp 97.6 F (36.4 C) (Oral)   Resp 16   Wt 154 lb 9.6 oz (70.1 kg)   BMI 27.39 kg/m   Wt Readings from Last 3 Encounters:  04/11/16 154 lb 9.6 oz (70.1 kg)  04/07/16 155 lb 9.6 oz (70.6 kg)  03/31/16 153 lb (69.4 kg)

## 2016-04-14 ENCOUNTER — Ambulatory Visit
Admission: RE | Admit: 2016-04-14 | Discharge: 2016-04-14 | Disposition: A | Discharge status: Home / Self Care | Payer: Self-pay | Source: Ambulatory Visit | Attending: Radiation Oncology | Admitting: Radiation Oncology

## 2016-04-14 NOTE — Progress Notes (Signed)
  Radiation Oncology         [phone] ________________________________  Name: Kelly Yu MRN: [id number]  Date: 03/13/2016  DOB: 1956/02/03  DIAGNOSIS:     ICD-9-CM ICD-10-CM   1. Primary cancer of upper outer quadrant of right female breast ([place]) 174.4 C50.411      SIMULATION AND TREATMENT PLANNING NOTE  The patient presented for simulation prior to beginning her course of radiation treatment for her diagnosis of right-sided breast cancer. The patient was placed in a supine position on a breast board. A customized vac-lock bag was constructed and this complex treatment device will be used on a daily basis during her treatment. In this fashion, a CT scan was obtained through the chest area and an isocenter was placed near the chest wall within the breast.  The patient will be planned to receive a course of radiation initially to a dose of 50.4 Gy. This will consist of a whole breast radiotherapy technique. To accomplish this, 2 customized blocks have been designed which will correspond to medial and lateral whole breast tangent fields. This treatment will be accomplished at 1.8 Gy per fraction. A forward planning technique will also be evaluated to determine if this approach improves the plan. It is anticipated that the patient will then receive a 10 Gy boost to the seroma cavity which has been contoured. This will be accomplished at 2 Gy per fraction.   This initial treatment will consist of a 3-D conformal technique. The seroma has been contoured as the primary target structure. Additionally, dose volume histograms of both this target as well as the lungs and heart will also be evaluated. Such an approach is necessary to ensure that the target area is adequately covered while the nearby critical  normal structures are adequately spared.  Plan:  The final anticipated total dose therefore will correspond to 60.4 Gy.    _______________________________   Jodelle Gross, MD, PhD

## 2016-04-15 ENCOUNTER — Encounter: Payer: Self-pay | Admitting: Radiation Oncology

## 2016-04-15 ENCOUNTER — Ambulatory Visit
Admission: RE | Admit: 2016-04-15 | Discharge: 2016-04-15 | Disposition: A | Discharge status: Home / Self Care | Payer: Self-pay | Source: Ambulatory Visit | Attending: Radiation Oncology | Admitting: Radiation Oncology

## 2016-04-16 ENCOUNTER — Ambulatory Visit
Admission: RE | Admit: 2016-04-16 | Discharge: 2016-04-16 | Disposition: A | Discharge status: Home / Self Care | Payer: Self-pay | Source: Ambulatory Visit | Attending: Radiation Oncology | Admitting: Radiation Oncology

## 2016-04-17 ENCOUNTER — Encounter: Payer: Self-pay | Admitting: Radiation Oncology

## 2016-04-17 ENCOUNTER — Ambulatory Visit
Admission: RE | Admit: 2016-04-17 | Discharge: 2016-04-17 | Disposition: A | Discharge status: Home / Self Care | Payer: No Typology Code available for payment source | Source: Ambulatory Visit | Attending: Radiation Oncology | Admitting: Radiation Oncology

## 2016-04-17 ENCOUNTER — Ambulatory Visit
Admission: RE | Admit: 2016-04-17 | Discharge: 2016-04-17 | Disposition: A | Discharge status: Home / Self Care | Payer: Self-pay | Source: Ambulatory Visit | Attending: Radiation Oncology | Admitting: Radiation Oncology

## 2016-04-17 VITALS — BP 155/93 | HR 61 | Temp 98.0°F | Resp 20 | Wt 154.0 lb

## 2016-04-17 DIAGNOSIS — C50411 Malignant neoplasm of upper-outer quadrant of right female breast: Secondary | ICD-10-CM

## 2016-04-17 NOTE — Progress Notes (Signed)
Weekly rad txs right breast 18/35 completed,  Mild erythema ,slight swelling of breast, tenderness and mild pain in breast, appetite good, using radiaplex bid Intrpreter Nuha Mohammed  4:20 PM BP (!) 155/93 (BP Location: Left Arm, Patient Position: Sitting, Cuff Size: Normal)   Pulse 61   Temp 98 F (36.7 C) (Oral)   Resp 20   Wt 154 lb (69.9 kg)   BMI 27.28 kg/m   Wt Readings from Last 3 Encounters:  04/17/16 154 lb (69.9 kg)  04/11/16 154 lb 9.6 oz (70.1 kg)  04/07/16 155 lb 9.6 oz (70.6 kg)

## 2016-04-17 NOTE — Progress Notes (Signed)
   Department of Radiation Oncology  Phone:  [phone] Fax:        [phone]  Weekly Treatment Note    Name: Kelly Yu Date: 04/17/2016 MRN: [id number] DOB: [birth year]   Diagnosis:     ICD-9-CM ICD-10-CM   1. 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 hyaluronate sodium (RADIAPLEXRX) GEL Apply 1 application topically once.    . non-metallic deodorant Kelly Yu) MISC Apply 1 application topically daily as needed.    Marland Kitchen anastrozole (ARIMIDEX) 1 MG tablet Take 1 tablet (1 mg total) by mouth daily. (Patient not taking: Reported on 04/17/2016) 90 tablet 4   No current facility-administered medications for this encounter.      ALLERGIES: Patient has no known allergies.   LABORATORY DATA:  Lab Results  Component Value Date   WBC 8.1 03/13/2016   HGB 12.2 03/13/2016   HCT 36.1 03/13/2016   MCV 87.2 03/13/2016   PLT 182 03/13/2016   Lab Results  Component Value Date   NA 142 03/13/2016   K 4.3 03/13/2016   CL 105 12/22/2015   CO2 25 03/13/2016   Lab Results  Component Value Date   ALT 10 03/13/2016   AST 19 03/13/2016   ALKPHOS 121 03/13/2016   BILITOT 0.37 03/13/2016     NARRATIVE: Kelly Yu was seen today for weekly treatment management. The chart was checked and the patient's films were reviewed.  She returns for weekly radiation treatment to the right breast, 18/35 completed. The patient is accompanied by interpreter, Kelly Yu, who facilitated our visit today. She is doing well overall. Nursing notes, mild erythema and slight swelling of the breast. She reports tenderness and burning sensation in the breast. She denies itching. Appetite is good. Using radiaplex bid.   PHYSICAL EXAMINATION: weight is 154 lb (69.9 kg). Her oral temperature is 98 F (36.7 C). Her blood  pressure is 155/93 (abnormal) and her pulse is 61. Her respiration is 20.      Alert and in no acute distress.   ASSESSMENT: The patient is doing satisfactorily with treatment.  PLAN: We will continue with the patient's radiation treatment as planned.    ------------------------------------------------  Kelly Gross, MD, PhD  This document serves as a record of services personally performed by Kyung Rudd, MD. It was created on his behalf by Arlyce Harman, a trained medical scribe. The creation of this record is based on the scribe's personal observations and the provider's statements to them. This document has been checked and approved by the attending provider.

## 2016-04-18 ENCOUNTER — Ambulatory Visit
Admission: RE | Admit: 2016-04-18 | Discharge: 2016-04-18 | Disposition: A | Discharge status: Home / Self Care | Payer: Self-pay | Source: Ambulatory Visit | Attending: Radiation Oncology | Admitting: Radiation Oncology

## 2016-04-21 ENCOUNTER — Telehealth (HOSPITAL_COMMUNITY): Payer: Self-pay | Admitting: *Deleted

## 2016-04-21 ENCOUNTER — Ambulatory Visit
Admission: RE | Admit: 2016-04-21 | Discharge: 2016-04-21 | Disposition: A | Discharge status: Home / Self Care | Payer: Self-pay | Source: Ambulatory Visit | Attending: Radiation Oncology | Admitting: Radiation Oncology

## 2016-04-21 NOTE — Telephone Encounter (Signed)
Patient and daughter returned call to [facility]. 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 Next pap smear due in one year. Patient voiced understanding.

## 2016-04-21 NOTE — Telephone Encounter (Signed)
Telephoned patient at home number and left message to return call to [facility] 

## 2016-04-22 ENCOUNTER — Ambulatory Visit
Admission: RE | Admit: 2016-04-22 | Discharge: 2016-04-22 | Disposition: A | Discharge status: Home / Self Care | Payer: Self-pay | Source: Ambulatory Visit | Attending: Radiation Oncology | Admitting: Radiation Oncology

## 2016-04-23 ENCOUNTER — Ambulatory Visit
Admission: RE | Admit: 2016-04-23 | Discharge: 2016-04-23 | Disposition: A | Discharge status: Home / Self Care | Payer: Self-pay | Source: Ambulatory Visit | Attending: Radiation Oncology | Admitting: Radiation Oncology

## 2016-04-24 ENCOUNTER — Ambulatory Visit
Admission: RE | Admit: 2016-04-24 | Discharge: 2016-04-24 | Disposition: A | Discharge status: Home / Self Care | Payer: Self-pay | Source: Ambulatory Visit | Attending: Radiation Oncology | Admitting: Radiation Oncology

## 2016-04-25 ENCOUNTER — Ambulatory Visit
Admission: RE | Admit: 2016-04-25 | Discharge: 2016-04-25 | Disposition: A | Discharge status: Home / Self Care | Payer: Self-pay | Source: Ambulatory Visit | Attending: Radiation Oncology | Admitting: Radiation Oncology

## 2016-04-25 ENCOUNTER — Encounter: Payer: Self-pay | Admitting: Radiation Oncology

## 2016-04-25 ENCOUNTER — Ambulatory Visit
Admission: RE | Admit: 2016-04-25 | Discharge: 2016-04-25 | Disposition: A | Discharge status: Home / Self Care | Payer: No Typology Code available for payment source | Source: Ambulatory Visit | Attending: Radiation Oncology | Admitting: Radiation Oncology

## 2016-04-25 VITALS — BP 136/93 | HR 70 | Temp 97.4°F | Resp 16 | Wt 157.0 lb

## 2016-04-25 DIAGNOSIS — C50411 Malignant neoplasm of upper-outer quadrant of right female breast: Secondary | ICD-10-CM

## 2016-04-25 NOTE — Progress Notes (Signed)
Weekly rad tx right breast 24/35 completed, , mild erythema and tanning on breast, skin intact, using radiaplex bid, slight discomfort in breast, mild fatigue, appetite good 4:03 PM BP (!) 136/93 (BP Location: Left Arm, Patient Position: Sitting, Cuff Size: Normal)   Pulse 70   Temp 97.4 F (36.3 C) (Oral)   Resp 16   Wt 157 lb (71.2 kg)   BMI 27.81 kg/m   Wt Readings from Last 3 Encounters:  04/25/16 157 lb (71.2 kg)  04/17/16 154 lb (69.9 kg)  04/11/16 154 lb 9.6 oz (70.1 kg)

## 2016-04-25 NOTE — Progress Notes (Signed)
  Department of Radiation Oncology Phone:  [phone] Fax:        [phone]  Weekly Treatment Note   Name: Kelly Yu Date: 04/25/2016 MRN: [id number] DOB: [birth year]  Diagnosis:     ICD-9-CM ICD-10-CM   1. 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 hyaluronate sodium (RADIAPLEXRX) GEL Apply 1 application topically once.    . non-metallic deodorant Jethro Poling) MISC Apply 1 application topically daily as needed.    Marland Kitchen anastrozole (ARIMIDEX) 1 MG tablet Take 1 tablet (1 mg total) by mouth daily. (Patient not taking: Reported on 04/25/2016) 90 tablet 4   No current facility-administered medications for this encounter.    ALLERGIES: Patient has no known allergies.  LABORATORY DATA:  Lab Results  Component Value Date   WBC 8.1 03/13/2016   HGB 12.2 03/13/2016   HCT 36.1 03/13/2016   MCV 87.2 03/13/2016   PLT 182 03/13/2016   Lab Results  Component Value Date   NA 142 03/13/2016   K 4.3 03/13/2016   CL 105 12/22/2015   CO2 25 03/13/2016   Lab Results  Component Value Date   ALT 10 03/13/2016   AST 19 03/13/2016   ALKPHOS 121 03/13/2016   BILITOT 0.37 03/13/2016     NARRATIVE: Kelly Yu was seen today for weekly treatment management. The chart was checked and the patient's films were reviewed.  The patient has completed 24/35 fractions to her right breast. She reports slight discomfort in the right breast. Mild erythema in the treatment area noted per nursing. The patient reports mild fatigue. She has a good appetite. An interpreter is with the patient today, as well as family.  PHYSICAL EXAMINATION: weight is 157 lb (71.2 kg). Her oral temperature is 97.4 F (36.3 C). Her blood pressure is 136/93 (abnormal) and her pulse is 70. Her respiration is 16.  Alert and in no acute distress. Some  fullness deep to the incision site, and mild induration which is stable. The breast is not warm to the touch, but mild hyperpigmentation is noted to the right side.  ASSESSMENT: The patient is doing satisfactorily with treatment.  PLAN: We will continue with the patient's radiation treatment as planned.   ------------------------------------------------   Tyler Pita, MD [place] Director and Director of Stereotactic Radiosurgery Direct Dial: [phone]  Fax: [phone] [email or web address]  Skype  [email or web address]  This document serves as a record of services personally performed by Tyler Pita, MD and Shona Simpson, PA. It was created on his behalf by Maryla Morrow, a trained medical scribe. The creation of this record is based on the scribe's personal observations and the provider's statements to them. This document has been checked and approved by the attending provider.

## 2016-04-27 ENCOUNTER — Ambulatory Visit: Payer: Self-pay

## 2016-04-29 ENCOUNTER — Ambulatory Visit
Admission: RE | Admit: 2016-04-29 | Discharge: 2016-04-29 | Disposition: A | Discharge status: Home / Self Care | Payer: Self-pay | Source: Ambulatory Visit | Attending: Radiation Oncology | Admitting: Radiation Oncology

## 2016-04-30 ENCOUNTER — Ambulatory Visit
Admission: RE | Admit: 2016-04-30 | Discharge: 2016-04-30 | Disposition: A | Discharge status: Home / Self Care | Payer: Self-pay | Source: Ambulatory Visit | Attending: Radiation Oncology | Admitting: Radiation Oncology

## 2016-04-30 ENCOUNTER — Ambulatory Visit: Payer: Self-pay | Admitting: Radiation Oncology

## 2016-05-01 ENCOUNTER — Ambulatory Visit
Admission: RE | Admit: 2016-05-01 | Discharge: 2016-05-01 | Disposition: A | Discharge status: Home / Self Care | Payer: Self-pay | Source: Ambulatory Visit | Attending: Radiation Oncology | Admitting: Radiation Oncology

## 2016-05-02 ENCOUNTER — Ambulatory Visit
Admission: RE | Admit: 2016-05-02 | Discharge: 2016-05-02 | Disposition: A | Discharge status: Home / Self Care | Payer: No Typology Code available for payment source | Source: Ambulatory Visit | Attending: Radiation Oncology | Admitting: Radiation Oncology

## 2016-05-02 ENCOUNTER — Ambulatory Visit
Admission: RE | Admit: 2016-05-02 | Discharge: 2016-05-02 | Disposition: A | Discharge status: Home / Self Care | Payer: Self-pay | Source: Ambulatory Visit | Attending: Radiation Oncology | Admitting: Radiation Oncology

## 2016-05-02 VITALS — BP 138/94 | HR 70 | Temp 97.6°F | Resp 12 | Wt 157.8 lb

## 2016-05-02 DIAGNOSIS — C50411 Malignant neoplasm of upper-outer quadrant of right female breast: Secondary | ICD-10-CM

## 2016-05-02 NOTE — Progress Notes (Signed)
  Department of Radiation Oncology Phone:  [phone] Fax:        [phone]  Weekly Treatment Note   Name: Kelly Yu Date: 05/02/2016 MRN: [id number] DOB: 11/22/1955  Diagnosis:     ICD-9-CM ICD-10-CM   1. Primary cancer of upper outer quadrant of right female breast ([place]) 174.4 C50.411    Current dose: 50.4 Gy  Current fraction: 28  MEDICATIONS: Current Outpatient Prescriptions  Medication Sig Dispense Refill  . anastrozole (ARIMIDEX) 1 MG tablet Take 1 tablet (1 mg total) by mouth daily. (Patient not taking: Reported on 04/25/2016) 90 tablet 4  . hyaluronate sodium (RADIAPLEXRX) GEL Apply 1 application topically once.    . non-metallic deodorant Jethro Poling) MISC Apply 1 application topically daily as needed.     No current facility-administered medications for this encounter.    ALLERGIES: Patient has no known allergies.  LABORATORY DATA:  Lab Results  Component Value Date   WBC 8.1 03/13/2016   HGB 12.2 03/13/2016   HCT 36.1 03/13/2016   MCV 87.2 03/13/2016   PLT 182 03/13/2016   Lab Results  Component Value Date   NA 142 03/13/2016   K 4.3 03/13/2016   CL 105 12/22/2015   CO2 25 03/13/2016   Lab Results  Component Value Date   ALT 10 03/13/2016   AST 19 03/13/2016   ALKPHOS 121 03/13/2016   BILITOT 0.37 03/13/2016     NARRATIVE: Kelly Yu was seen today for weekly treatment management. The chart was checked and the patient's films were reviewed.  The patient has completed 28/35 fractions to her right breast. The patient denies any pain currently. She denies edema. She reports using Radiaplex as directed. She complains of fatigue. The patient is accompanied by her daughter today, who is interpreting for her.  PHYSICAL EXAMINATION: weight is 157 lb 12.8 oz (71.6 kg). Her oral temperature is 97.6 F (36.4 C). Her blood pressure is 138/94 (abnormal) and her pulse is 70. Her respiration is 12 and oxygen saturation is 100%.  Alert and in no acute  distress.   ASSESSMENT: The patient is doing satisfactorily with treatment.  PLAN: We will continue with the patient's radiation treatment as planned.     ------------------------------------------------  Jodelle Gross, MD, PhD  This document serves as a record of services personally performed by Kyung Rudd, MD. It was created on his behalf by Maryla Morrow, a trained medical scribe. The creation of this record is based on the scribe's personal observations and the provider's statements to them. This document has been checked and approved by the attending provider.

## 2016-05-02 NOTE — Progress Notes (Signed)
Complex simulation note  Diagnosis: right-sided breast cancer  Narrative The patient has initially been planned to receive a course of whole breast radiation to a dose of 50.4 Gy in 28 fractions. The patient will now receive an additional boost to the seroma cavity which has been contoured. This will correspond to a boost of 14 Gy at 2 Gy per fraction. To accomplish this, an additional 3 customized blocks have been designed for this purpose. A complex isodose plan is requested to ensure that the target area is adequately covered with radiation dose and that the nearby normal structures such as the lung are adequately spared. The patient's final total dose will be 6440 Gy.  ------------------------------------------------  Jodelle Gross, MD, PhD

## 2016-05-02 NOTE — Progress Notes (Signed)
PAIN: She is currently in no pain.  SKIN: Pt right breast- positive for Dryness, Hyperpigmentation, erythema, breast tenderness and dry desquamation-small area in right axilla.  Pt denies edema.  Pt continues to apply Radiaplex as directed. OTHER: Pt complains of fatigue. BP (!) 138/94   Pulse 70   Temp 97.6 F (36.4 C) (Oral)   Resp 12   Wt 157 lb 12.8 oz (71.6 kg)   SpO2 100%   BMI 27.95 kg/m  Wt Readings from Last 3 Encounters:  05/02/16 157 lb 12.8 oz (71.6 kg)  04/25/16 157 lb (71.2 kg)  04/17/16 154 lb (69.9 kg)

## 2016-05-06 ENCOUNTER — Ambulatory Visit
Admission: RE | Admit: 2016-05-06 | Discharge: 2016-05-06 | Disposition: A | Discharge status: Home / Self Care | Payer: Self-pay | Source: Ambulatory Visit | Attending: Radiation Oncology | Admitting: Radiation Oncology

## 2016-05-06 ENCOUNTER — Ambulatory Visit: Payer: Self-pay

## 2016-05-07 ENCOUNTER — Ambulatory Visit
Admission: RE | Admit: 2016-05-07 | Discharge: 2016-05-07 | Disposition: A | Discharge status: Home / Self Care | Payer: Self-pay | Source: Ambulatory Visit | Attending: Radiation Oncology | Admitting: Radiation Oncology

## 2016-05-07 ENCOUNTER — Ambulatory Visit: Payer: Self-pay

## 2016-05-08 ENCOUNTER — Ambulatory Visit: Payer: Self-pay

## 2016-05-08 ENCOUNTER — Ambulatory Visit
Admission: RE | Admit: 2016-05-08 | Discharge: 2016-05-08 | Disposition: A | Discharge status: Home / Self Care | Payer: Self-pay | Source: Ambulatory Visit | Attending: Radiation Oncology | Admitting: Radiation Oncology

## 2016-05-09 ENCOUNTER — Ambulatory Visit
Admission: RE | Admit: 2016-05-09 | Discharge: 2016-05-09 | Disposition: A | Discharge status: Home / Self Care | Payer: Self-pay | Source: Ambulatory Visit | Attending: Radiation Oncology | Admitting: Radiation Oncology

## 2016-05-09 ENCOUNTER — Ambulatory Visit
Admission: RE | Admit: 2016-05-09 | Discharge: 2016-05-09 | Disposition: A | Discharge status: Home / Self Care | Payer: No Typology Code available for payment source | Source: Ambulatory Visit | Attending: Radiation Oncology | Admitting: Radiation Oncology

## 2016-05-09 ENCOUNTER — Ambulatory Visit: Payer: Self-pay

## 2016-05-09 ENCOUNTER — Encounter: Payer: Self-pay | Admitting: Radiation Oncology

## 2016-05-09 VITALS — BP 170/71 | HR 67 | Temp 97.7°F | Resp 20 | Wt 159.4 lb

## 2016-05-09 DIAGNOSIS — C50411 Malignant neoplasm of upper-outer quadrant of right female breast: Secondary | ICD-10-CM

## 2016-05-09 NOTE — Progress Notes (Signed)
Weekly rad tx right breast,32/35 completed,  erythma and  dry peeling under axilla and under inframmary fold, gave neosporin samples to start using in those two areas, radiaplex rest of breast , interpreter Nuha Mohmed,  and daughter   Gave verbal understanding, appetite good, mild fatigue, bone pillow given to patient 4:09 PM BP (!) 170/71 (BP Location: Right Arm, Patient Position: Sitting, Cuff Size: Normal)   Pulse 67   Temp 97.7 F (36.5 C) (Oral)   Resp 20   Wt 159 lb 6.4 oz (72.3 kg)   BMI 28.24 kg/m   Wt Readings from Last 3 Encounters:  05/09/16 159 lb 6.4 oz (72.3 kg)  05/02/16 157 lb 12.8 oz (71.6 kg)  04/25/16 157 lb (71.2 kg)

## 2016-05-09 NOTE — Progress Notes (Signed)
   Department of Radiation Oncology  Phone:  [phone] Fax:        [phone]  Weekly Treatment Note    Name: Kelly Yu Date: 05/09/2016 MRN: [id number] DOB: 1955/07/05   Diagnosis:     ICD-9-CM ICD-10-CM   1. 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 hyaluronate sodium (RADIAPLEXRX) GEL Apply 1 application topically once.    . non-metallic deodorant Kelly Yu) MISC Apply 1 application topically daily as needed.    Marland Kitchen anastrozole (ARIMIDEX) 1 MG tablet Take 1 tablet (1 mg total) by mouth daily. (Patient not taking: Reported on 05/09/2016) 90 tablet 4   No current facility-administered medications for this encounter.      ALLERGIES: Patient has no known allergies.   LABORATORY DATA:  Lab Results  Component Value Date   WBC 8.1 03/13/2016   HGB 12.2 03/13/2016   HCT 36.1 03/13/2016   MCV 87.2 03/13/2016   PLT 182 03/13/2016   Lab Results  Component Value Date   NA 142 03/13/2016   K 4.3 03/13/2016   CL 105 12/22/2015   CO2 25 03/13/2016   Lab Results  Component Value Date   ALT 10 03/13/2016   AST 19 03/13/2016   ALKPHOS 121 03/13/2016   BILITOT 0.37 03/13/2016     NARRATIVE: Kelly Yu was seen today for weekly treatment management. The chart was checked and the patient's films were reviewed.  Weekly rad tx right breast,32/35 completed,  erythma and  dry peeling under axilla and under inframmary fold, gave neosporin samples to start using in those two areas, radiaplex rest of breast , interpreter Kelly Yu,  and daughter   Gave verbal understanding, appetite good, mild fatigue, bone pillow given to patient 4:39 PM BP (!) 170/71 (BP Location: Right Arm, Patient Position: Sitting, Cuff Size: Normal)   Pulse 67   Temp 97.7 F (36.5 C) (Oral)   Resp 20   Wt 159 lb 6.4 oz  (72.3 kg)   BMI 28.24 kg/m   Wt Readings from Last 3 Encounters:  05/09/16 159 lb 6.4 oz (72.3 kg)  05/02/16 157 lb 12.8 oz (71.6 kg)  04/25/16 157 lb (71.2 kg)    PHYSICAL EXAMINATION: weight is 159 lb 6.4 oz (72.3 kg). Her oral temperature is 97.7 F (36.5 C). Her blood pressure is 170/71 (abnormal) and her pulse is 67. Her respiration is 20.      Dry desquamation in the right axilla with some wild hyperpigmentation diffusely. Seroma cavity with some firmness without any overlying significant increased erythema or sign of infection.  ASSESSMENT: The patient is doing satisfactorily with treatment.   PLAN: We will continue with the patient's radiation treatment as planned.  She will finish her radiation treatment next week. [place] at the end of the month. She will let us know if she feels that she needs to be seen before then. I believe that her skin is going to heal very well. Otherwise, she will contact us when she returns. The family is also going to schedule and appointment with medical oncology later this month.

## 2016-05-12 ENCOUNTER — Ambulatory Visit: Payer: Self-pay

## 2016-05-12 ENCOUNTER — Ambulatory Visit
Admission: RE | Admit: 2016-05-12 | Discharge: 2016-05-12 | Disposition: A | Discharge status: Home / Self Care | Payer: Self-pay | Source: Ambulatory Visit | Attending: Radiation Oncology | Admitting: Radiation Oncology

## 2016-05-13 ENCOUNTER — Ambulatory Visit: Payer: Self-pay

## 2016-05-13 ENCOUNTER — Other Ambulatory Visit: Payer: No Typology Code available for payment source

## 2016-05-13 ENCOUNTER — Ambulatory Visit: Payer: No Typology Code available for payment source | Admitting: Oncology

## 2016-05-13 ENCOUNTER — Encounter: Payer: Self-pay | Admitting: Oncology

## 2016-05-13 ENCOUNTER — Ambulatory Visit
Admission: RE | Admit: 2016-05-13 | Discharge: 2016-05-13 | Disposition: A | Discharge status: Home / Self Care | Payer: Self-pay | Source: Ambulatory Visit | Attending: Radiation Oncology | Admitting: Radiation Oncology

## 2016-05-14 ENCOUNTER — Encounter: Payer: Self-pay | Admitting: Radiation Oncology

## 2016-05-14 ENCOUNTER — Ambulatory Visit
Admission: RE | Admit: 2016-05-14 | Discharge: 2016-05-14 | Disposition: A | Discharge status: Home / Self Care | Payer: Self-pay | Source: Ambulatory Visit | Attending: Radiation Oncology | Admitting: Radiation Oncology

## 2016-05-15 NOTE — Progress Notes (Signed)
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  Indication for treatment:  Curative       Radiation treatment dates:   03/23/2016 to 05/15/2015  Site/dose:   The patient initially received a dose of 50.4 Gy in 28 fractions to the breast using whole-breast tangent fields. This was delivered using a 3-D conformal technique. The patient then received a boost to the seroma. This delivered an additional 14 Gy in 7 fractions using an isodose plan with energy 10X and 6X. The total dose was 64.4 Gy.  Narrative: The patient tolerated radiation treatment relatively well.  She reports mild fatigue with treatment. The patient had some expected skin irritation as she progressed during treatment including, dry desquamation in the right axilla with some mild hyperpigmentation diffusely and seroma cavity with some firmness without any overlying significant increased erythema or sign of infection. Moist desquamation was not present at the end of treatment.  Plan: The patient has completed radiation treatment. The patient will return to radiation oncology clinic for routine followup in one month. I advised the patient to call or return sooner if they have any questions or concerns related to their recovery or treatment. ________________________________  Jodelle Gross, M.D., Ph.D.  This document serves as a record of services personally performed by Kyung Rudd, MD. It was created on his behalf by Arlyce Harman, a trained medical scribe. The creation of this record is based on the scribe's personal observations and the provider's statements to them. This document has been checked and approved by the attending provider.

## 2016-05-20 MED FILL — ANASTROZOLE 1 MG TABLET: 1 | 180 days supply | Qty: 180 | Fill #0

## 2016-05-22 ENCOUNTER — Ambulatory Visit (HOSPITAL_BASED_OUTPATIENT_CLINIC_OR_DEPARTMENT_OTHER): Payer: Self-pay | Admitting: Oncology

## 2016-05-22 ENCOUNTER — Telehealth: Payer: Self-pay | Admitting: Oncology

## 2016-05-22 VITALS — BP 143/84 | HR 60 | Temp 98.6°F | Resp 16 | Ht 63.0 in | Wt 157.3 lb

## 2016-05-22 DIAGNOSIS — Z170 Estrogen receptor positive status [ER+]: Secondary | ICD-10-CM

## 2016-05-22 DIAGNOSIS — C50411 Malignant neoplasm of upper-outer quadrant of right female breast: Secondary | ICD-10-CM

## 2016-05-22 DIAGNOSIS — N63 Unspecified lump in unspecified breast: Secondary | ICD-10-CM

## 2016-05-22 NOTE — Telephone Encounter (Signed)
Gave son avs report and appointments for April. Central radiology will call re scan.

## 2016-05-22 NOTE — Progress Notes (Signed)
Her  I'll plan will be  [place]  Telephone:(336) [phone] Fax:(336) [phone]     ID: Kelly Yu DOB: [birth year]  MR#: [id number]  [id number]  Patient Care Team: No Pcp Per Patient as PCP - General (General Practice) Chauncey Cruel, MD as Consulting Physician (Oncology) Rolm Bookbinder, MD as Consulting Physician (General Surgery) Kyung Rudd, MD as Consulting Physician (Radiation Oncology) Chauncey Cruel, MD OTHER MD:  CHIEF COMPLAINT: Invasive ductal carcinoma, unknown prognostic panel  CURRENT TREATMENT: Radiation, anastrozole   BREAST CANCER HISTORY: On the original intake note:  Luta tells me she had pain in the right breast, which brought her to a physician in [place], where she normally resides. They obtained a mammogram, but she does not know those results. 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 There is no prognostic panel  More recently, while visiting family in [place], the patient presented to the emergency department with a complaint of right breast pain and redness. She was afebrile. Needle aspiration was obtained and the patient was started on Augmentin. The aspirate cultures remain negative.  On 12/22/2015 she had a right breast ultrasound at the [facility]. This showed, at the 11:00 position of the right breast, 8 cm from the nipple, a large complex cystic region measuring up to 8.5 cm. There was no hyperemia.   The patient was referred to surgery, and on 01/18/2016 she had bilateral diagnostic mammography with tomography [facility] The breast density was category B. In the upper outer right breast there was a 5.7 cm mass, with an associated 1 cm satellite. There was a  large firm mass deforming the contour of the breast and targeted ultrasound demonstrated a large complex collection in the upper outer quadrant of the breast measuring up to 4.8 cm. A borderline lymph node with a moderately thickened cortex was visualized in the right axilla.  Biopsy of the right breast mass and right axillary lymph node 01/25/2016 showed ([id number] [id number]) no evidence of malignancy. Cultures from the 01/25/2016 biopsy all remained negative.  The was referred to radiation oncology and is currently receiving adjuvant radiation. Her subsequent history is as detailed below  INTERVAL HISTORY: Julyanna  returns today for follow-up of her estrogen receptor positive breast cancer accompanied by her daughter [name] and the patient's son in [employer]. Markie completed radiation treatments last week. Generally she did well with dose. She did have some peeling and some hyperpigmentation problems and felt a bit tired towards the end but today she denies feeling tired and Sr. skin is healing very nicely.  She is now ready to start anti-estrogens.   REVIEW OF SYSTEMS: Alazia sometimes experiences sharp shooting pains in the surgical breast. This concerns her. She has a bit of soreness in that area of course as well. She denies unusual headaches, visual changes, nausea, vomiting, cough, phlegm production, pleurisy, shortness of breath, or change in bowel or bladder habits. At home she mostly does a little bit of cooking and watches [employer]. A detailed review of systems today was otherwise stable.   PAST MEDICAL HISTORY: Past Medical History:  Diagnosis Date  . Breast cancer ([place])    Diagnosed in [place] in July 2017, Stage IA, T1c, Nx, Mx invasive ductal carcinoma  . Ulcer ([place])  PAST SURGICAL HISTORY: Past Surgical History:  Procedure Laterality Date  . right breast lumpectomy      FAMILY HISTORY Family History  Problem Relation Age of Onset  . Diabetes Sister   The patient's father  died from renal failure in his 52s the patient's mother died from a blood clot in her 74s. The patient had 4 brothers, 8 sisters. There is no history of breast or ovarian cancer in the family to the patient's knowledge  GYNECOLOGIC HISTORY:  No LMP recorded. Patient is postmenopausal. Menarche age 20, first live birth age 61, the patient is GX B8. She is now postmenopausal. She never took hormone replacement.  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 While visiting in the [place] she lives with her daughter [name]. 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 Also at home are the patient's son-in-law [name] , who works for [employer], and their 3 children age 75, 23, and 60    ADVANCED DIRECTIVES: The patient's daughter [name] and son-in-law [name] are jointly the patient's healthcare power of attorney    HEALTH MAINTENANCE: Social History  Substance Use Topics  . Smoking status: Never Smoker  . Smokeless tobacco: Never Used  . Alcohol use No     Colonoscopy:  PAP: 01/18/2016 / benign   Bone density:   No Known Allergies  Current Outpatient Prescriptions  Medication Sig Dispense Refill  . anastrozole (ARIMIDEX) 1 MG tablet Take 1 tablet (1 mg total) by mouth daily. (Patient not taking: Reported on 05/09/2016) 90 tablet 4  . hyaluronate sodium (RADIAPLEXRX) GEL Apply 1 application topically once.    . non-metallic deodorant Jethro Poling) MISC Apply 1 application topically daily as needed.     No current facility-administered medications for this visit.     OBJECTIVE: Middle-aged middle [place] woman Who appears stated age  39:   05/22/16 1431  BP: (!) 143/84  Pulse: 60  Resp: 16  Temp: 98.6 F (37 C)     Body mass index is 27.86 kg/m.    ECOG FS:1 - Symptomatic but completely ambulatory  Sclerae unicteric, pupils round and equal Oropharynx clear and moist-- no thrush or other lesions No cervical or supraclavicular  adenopathy Lungs no rales or rhonchi Heart regular rate and rhythm Abd soft, nontender, positive bowel sounds MSK no focal spinal tenderness, no upper extremity lymphedema Neuro: nonfocal, well oriented, appropriate affect Breasts: The right breast is status post lumpectomy and just completed radiation. There is mild hyperpigmentation over the radiation port area. There is residual dry desquamation chiefly in the axilla. The cosmetic result overall is good repair there is no evidence of residual disease. The right axilla is benign. The left breast is unremarkable.   LAB RESULTS:  CMP     Component Value Date/Time   NA 142 03/13/2016 1601   K 4.3 03/13/2016 1601   CL 105 12/22/2015 1628   CO2 25 03/13/2016 1601   GLUCOSE 137 03/13/2016 1601   BUN 14.4 03/13/2016 1601   CREATININE 0.8 03/13/2016 1601   CALCIUM 9.2 03/13/2016 1601   PROT 7.8 03/13/2016 1601   ALBUMIN 3.8 03/13/2016 1601   AST 19 03/13/2016 1601   ALT 10 03/13/2016 1601   ALKPHOS 121 03/13/2016 1601   BILITOT 0.37 03/13/2016 1601   GFRNONAA >60 12/22/2015 1628   GFRAA >60 12/22/2015 1628    INo results found for: SPEP, UPEP  Lab Results  Component Value Date  WBC 8.1 03/13/2016   NEUTROABS 4.3 03/13/2016   HGB 12.2 03/13/2016   HCT 36.1 03/13/2016   MCV 87.2 03/13/2016   PLT 182 03/13/2016      Chemistry      Component Value Date/Time   NA 142 03/13/2016 1601   K 4.3 03/13/2016 1601   CL 105 12/22/2015 1628   CO2 25 03/13/2016 1601   BUN 14.4 03/13/2016 1601   CREATININE 0.8 03/13/2016 1601      Component Value Date/Time   CALCIUM 9.2 03/13/2016 1601   ALKPHOS 121 03/13/2016 1601   AST 19 03/13/2016 1601   ALT 10 03/13/2016 1601   BILITOT 0.37 03/13/2016 1601       No results found for: LABCA2  No components found for: LABCA125  No results for input(s): INR in the last 168 hours.  Urinalysis    Component Value Date/Time   COLORURINE YELLOW 11/13/2012 1059   APPEARANCEUR CLEAR  11/13/2012 1059   LABSPEC 1.010 11/13/2012 1059   PHURINE 8.5 (H) 11/13/2012 1059   GLUCOSEU NEGATIVE 11/13/2012 1059   HGBUR NEGATIVE 11/13/2012 1059   BILIRUBINUR NEGATIVE 11/13/2012 1059   KETONESUR NEGATIVE 11/13/2012 1059   PROTEINUR NEGATIVE 11/13/2012 1059   UROBILINOGEN 0.2 11/13/2012 1059   NITRITE NEGATIVE 11/13/2012 1059   LEUKOCYTESUR TRACE (A) 11/13/2012 1059     STUDIES: No results found.  ELIGIBLE FOR AVAILABLE RESEARCH PROTOCOL: No  ASSESSMENT: 62 y.o. [place] Woman, originally from [place], status post right breast upper outer quadrant lumpectomy July 2017 for a pT1c pNX, stage IA invasive ductal carcinoma,  grade 3, prognostic panel not available  (1) complex cystic mass in the right breast and suspicious right axillary lymph node both biopsied 01/25/2016, showing no evidence of malignancy. Multiple cultures were negative as well   (2) adjuvant radiation 03/23/2016 to 05/15/2015: Site/dose:   The patient initially received a dose of 50.4 Gy in 28 fractions to the breast using whole-breast tangent fields. This was delivered using a 3-D conformal technique. The patient then received a boost to the seroma. This delivered an additional 14 Gy in 7 fractions using an isodose plan with energy 10X and 6X. The total dose was 64.4 Gy.  (3) to start anastrozole 06/05/2016  PLAN: I spent approximately 40 minutes today with the patient and her son and daughter going over her situation from the beginning. They understand her disease was not metastatic and therefore it is curable. We reviewed the fact that she has completed local treatment namely surgery and radiation. She will need a couple more weeks I think to more fully recover from the radiation treatments as her skin has not completely healed yet. I think by 06/05/2016 she will be ready to start systemic therapy.  That will consist of anastrozole. We discussed the possible toxicities, side effects and complications of this  agent. They understand this prevents her body from making estrogen and therefore the cancer, which depends on estrogen for growth, will "*". She understands she will need to be on this medication for 5 years.  They have already purchased 6 months worth of the medication for $24, which is a very favorable price.  They had many questions regarding diet and activity. Apparently in [place] when a person is diagnosed with cancer they are told not to eat any meet and to have very restricted activities of various sorts. I emphasized that her diet is fine that she can eat meat and vegetables as the main part of her diet, but that  some carbohydrates are also okay. I also emphasized that there are no specific restrictions and she should be just as normal as she can be.  In Ramadan of course they don't have anything to eat during the day. She can take her anastrozole at night with no problem  She is going to be traveling back tomorrow, in February. She will return in April. When she returns we will obtain a CT scan of the chest and baseline labs and then she will return to see me. From that point we will start seeing her on an every three-month basis for 1 year and then every 6 months for 1 year and then yearly.  They know to call for any problems that may develop before her next visit.    Chauncey Cruel, MD   05/22/2016 2:43 PM Medical Oncology and Hematology [facility] [address] [place], [place] [address] Tel. [phone]    Fax. [phone]

## 2016-08-13 ENCOUNTER — Ambulatory Visit: Payer: No Typology Code available for payment source

## 2016-08-18 ENCOUNTER — Encounter (HOSPITAL_COMMUNITY): Payer: Self-pay

## 2016-08-18 ENCOUNTER — Other Ambulatory Visit (HOSPITAL_BASED_OUTPATIENT_CLINIC_OR_DEPARTMENT_OTHER): Payer: Self-pay

## 2016-08-18 ENCOUNTER — Ambulatory Visit (HOSPITAL_COMMUNITY)
Admission: RE | Admit: 2016-08-18 | Discharge: 2016-08-18 | Disposition: A | Discharge status: Home / Self Care | Payer: Self-pay | Source: Ambulatory Visit | Attending: Oncology | Admitting: Oncology

## 2016-08-18 DIAGNOSIS — Z170 Estrogen receptor positive status [ER+]: Secondary | ICD-10-CM | POA: Insufficient documentation

## 2016-08-18 DIAGNOSIS — I700 Atherosclerosis of aorta: Secondary | ICD-10-CM | POA: Insufficient documentation

## 2016-08-18 DIAGNOSIS — R590 Localized enlarged lymph nodes: Secondary | ICD-10-CM | POA: Insufficient documentation

## 2016-08-18 DIAGNOSIS — C50411 Malignant neoplasm of upper-outer quadrant of right female breast: Secondary | ICD-10-CM | POA: Insufficient documentation

## 2016-08-18 DIAGNOSIS — N63 Unspecified lump in unspecified breast: Secondary | ICD-10-CM

## 2016-08-18 DIAGNOSIS — Z9889 Other specified postprocedural states: Secondary | ICD-10-CM | POA: Insufficient documentation

## 2016-08-18 LAB — COMPREHENSIVE METABOLIC PANEL
ALBUMIN: 4 g/dL (ref 3.5–5.0)
ALT: 14 U/L (ref 0–55)
ANION GAP: 10 meq/L (ref 3–11)
AST: 21 U/L (ref 5–34)
Alkaline Phosphatase: 94 U/L (ref 40–150)
BUN: 12.2 mg/dL (ref 7.0–26.0)
CO2: 27 mEq/L (ref 22–29)
Calcium: 9.5 mg/dL (ref 8.4–10.4)
Chloride: 106 mEq/L (ref 98–109)
Creatinine: 0.7 mg/dL (ref 0.6–1.1)
EGFR: >90 mL/min/{1.73_m2} (ref >90)
GLUCOSE: 88 mg/dL (ref 70–140)
Potassium: 4.4 mEq/L (ref 3.5–5.1)
SODIUM: 143 meq/L (ref 136–145)
TOTAL PROTEIN: 7.6 g/dL (ref 6.4–8.3)
Total Bilirubin: 0.57 mg/dL (ref 0.20–1.20)

## 2016-08-18 LAB — CBC WITH DIFFERENTIAL/PLATELET
BASO%: 0.8 % (ref 0.0–2.0)
Basophils Absolute: 0.1 10*3/uL (ref 0.0–0.1)
EOS%: 2.6 % (ref 0.0–7.0)
Eosinophils Absolute: 0.2 10*3/uL (ref 0.0–0.5)
HCT: 35.6 % (ref 34.8–46.6)
HEMOGLOBIN: 12.2 g/dL (ref 11.6–15.9)
LYMPH%: 28.2 % (ref 14.0–49.7)
MCH: 30 pg (ref 25.1–34.0)
MCHC: 34.2 g/dL (ref 31.5–36.0)
MCV: 87.7 fL (ref 79.5–101.0)
MONO#: 0.7 10*3/uL (ref 0.1–0.9)
MONO%: 9.5 % (ref 0.0–14.0)
NEUT%: 58.9 % (ref 38.4–76.8)
NEUTROS ABS: 4.6 10*3/uL (ref 1.5–6.5)
Platelets: 210 10*3/uL (ref 145–400)
RBC: 4.06 10*6/uL (ref 3.70–5.45)
RDW: 13.9 % (ref 11.2–14.5)
WBC: 7.8 10*3/uL (ref 3.9–10.3)
lymph#: 2.2 10*3/uL (ref 0.9–3.3)

## 2016-08-18 MED ORDER — IOPAMIDOL (ISOVUE-300) INJECTION 61%
INTRAVENOUS | Status: AC
  Administered 2016-08-18: 75 mL
  Filled 2016-08-18: qty 75

## 2016-08-20 ENCOUNTER — Ambulatory Visit: Payer: No Typology Code available for payment source

## 2016-08-20 ENCOUNTER — Ambulatory Visit: Payer: Self-pay | Attending: Internal Medicine

## 2016-08-25 ENCOUNTER — Ambulatory Visit (HOSPITAL_BASED_OUTPATIENT_CLINIC_OR_DEPARTMENT_OTHER): Payer: Self-pay | Admitting: Oncology

## 2016-08-25 VITALS — BP 142/69 | HR 67 | Temp 98.1°F | Resp 18 | Ht 63.0 in | Wt 159.9 lb

## 2016-08-25 DIAGNOSIS — Z79811 Long term (current) use of aromatase inhibitors: Secondary | ICD-10-CM

## 2016-08-25 DIAGNOSIS — C50411 Malignant neoplasm of upper-outer quadrant of right female breast: Secondary | ICD-10-CM

## 2016-08-25 DIAGNOSIS — Z170 Estrogen receptor positive status [ER+]: Secondary | ICD-10-CM

## 2016-08-25 NOTE — Progress Notes (Addendum)
Her  I'll plan will be  [place]  Telephone:(336) [phone] Fax:(336) [phone]     ID: Kelly Yu DOB: [birth year]  MR#: [id number]  NMM#:[id number]  Patient Care Team: No Pcp Per Patient as PCP - General (General Practice) Kelly Cruel, MD as Consulting Physician (Oncology) Kelly Bookbinder, MD as Consulting Physician (General Surgery) Kelly Rudd, MD as Consulting Physician (Radiation Oncology) Kelly Cruel, MD OTHER MD:  CHIEF COMPLAINT: Invasive ductal carcinoma, unknown prognostic panel  CURRENT TREATMENT: anastrozole   BREAST CANCER HISTORY: On the original intake note:  Kelly Yu tells me she had pain in the right breast, which brought her to a physician in [place], where she normally resides. They obtained a mammogram, but she does not know those results. 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 There is no prognostic panel  More recently, while visiting family in [place], the patient presented to the emergency department with a complaint of right breast pain and redness. She was afebrile. Needle aspiration was obtained and the patient was started on Augmentin. The aspirate cultures remain negative.  On 12/22/2015 she had a right breast ultrasound at the [facility]. This showed, at the 11:00 position of the right breast, 8 cm from the nipple, a large complex cystic region measuring up to 8.5 cm. There was no hyperemia.   The patient was referred to surgery, and on 01/18/2016 she had bilateral diagnostic mammography with tomography and [facility] The breast density was category B. In the upper outer right breast there was a 5.7 cm mass, with an associated 1 cm satellite. There was a large firm  mass deforming the contour of the breast and targeted ultrasound demonstrated a large complex collection in the upper outer quadrant of the breast measuring up to 4.8 cm. A borderline lymph node with a moderately thickened cortex was visualized in the right axilla.  Biopsy of the right breast mass and right axillary lymph node 01/25/2016 showed ([id number]) no evidence of malignancy. Cultures from the 01/25/2016 biopsy all remained negative.  She was referred to radiation oncology to receive adjuvant radiation. Her subsequent history is as detailed below  INTERVAL HISTORY: Kelly Yu returns today for follow-up of her estrogen receptor positive breast cancer accompanied by her daughter and by a Optometrist. Kelly Yu started anastrozole the first week in February. She is tolerating it well. She is not having problems with hot flashes. She is not complaining of multiple aches and pains. She was able to obtain 6 month supply of the drug for approximately $20.  REVIEW OF SYSTEMS: Kelly Yu is worried because she is having some pain in her surgical breast. The pain can be like a shooting pain or can be some soreness. She does not have any numbness in the medial aspect of the right upper extremity. [facility] Sometimes she takes walks. Most of the time she has at home watching TV or reading. A detailed review of systems today was otherwise stable.  PAST MEDICAL HISTORY: Past Medical History:  Diagnosis Date  . Breast cancer ([place])    Diagnosed in [place] in July 2017, Stage IA, T1c, Nx, Mx invasive ductal carcinoma  . Ulcer     PAST SURGICAL HISTORY: Past Surgical History:  Procedure Laterality  Date  . right breast lumpectomy      FAMILY HISTORY Family History  Problem Relation Age of Onset  . Diabetes Sister   The patient's father died from renal failure in his 5s the patient's mother died from a blood clot in her 79s. The patient had 4  brothers, 8 sisters. There is no history of breast or ovarian cancer in the family to the patient's knowledge  GYNECOLOGIC HISTORY:  No LMP recorded. Patient is postmenopausal. Menarche age 17, first live birth age 32, the patient is GX B8. She is now postmenopausal. She never took hormone replacement.  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 While visiting in the [place] she lives with her daughter Kelly Yu. 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 Also at home are the patient's son-in-law Kelly Yu , who works for [employer], and their 3 children age 28, 49, and 48    ADVANCED DIRECTIVES: The patient's daughter Kelly Yu and son-in-law Kelly Yu are jointly the patient's healthcare power of attorney    HEALTH MAINTENANCE: Social History  Substance Use Topics  . Smoking status: Never Smoker  . Smokeless tobacco: Never Used  . Alcohol use No     Colonoscopy:  PAP: 01/18/2016 / benign   Bone density:   No Known Allergies  Current Outpatient Prescriptions  Medication Sig Dispense Refill  . anastrozole (ARIMIDEX) 1 MG tablet Take 1 tablet (1 mg total) by mouth daily. (Patient not taking: Reported on 05/09/2016) 90 tablet 4  . hyaluronate sodium (RADIAPLEXRX) GEL Apply 1 application topically once.    . non-metallic deodorant Kelly Yu) MISC Apply 1 application topically daily as needed.     No current facility-administered medications for this visit.     OBJECTIVE: Middle-aged middle [place] woman In no acute distress   Vitals:   08/25/16 1536  BP: (!) 142/69  Pulse: 67  Resp: 18  Temp: 98.1 F (36.7 C)     Body mass index is 28.33 kg/m.    ECOG FS:1 - Symptomatic but completely ambulatory  Sclerae unicteric, EOMs intact Oropharynx clear and moist No cervical or supraclavicular adenopathy Lungs no rales or rhonchi Heart regular rate and rhythm Abd soft, nontender, positive bowel sounds MSK no focal spinal tenderness, no upper  extremity lymphedema Neuro: nonfocal, well oriented, appropriate affect Breasts: The right breast is status post lumpectomy and radiation. There is an area of induration associated with the scar. This is expected. There is no evidence of local recurrence. The left breast is unremarkable. Both axillae are benign.  LAB RESULTS:  CMP     Component Value Date/Time   NA 143 08/18/2016 1439   K 4.4 08/18/2016 1439   CL 105 12/22/2015 1628   CO2 27 08/18/2016 1439   GLUCOSE 88 08/18/2016 1439   BUN 12.2 08/18/2016 1439   CREATININE 0.7 08/18/2016 1439   CALCIUM 9.5 08/18/2016 1439   PROT 7.6 08/18/2016 1439   ALBUMIN 4.0 08/18/2016 1439   AST 21 08/18/2016 1439   ALT 14 08/18/2016 1439   ALKPHOS 94 08/18/2016 1439   BILITOT 0.57 08/18/2016 1439   GFRNONAA >60 12/22/2015 1628   GFRAA >60 12/22/2015 1628    INo results found for: SPEP, UPEP  Lab Results  Component Value Date   WBC 7.8 08/18/2016   NEUTROABS 4.6 08/18/2016   HGB 12.2 08/18/2016   HCT 35.6 08/18/2016   MCV 87.7 08/18/2016   PLT 210 08/18/2016  Chemistry      Component Value Date/Time   NA 143 08/18/2016 1439   K 4.4 08/18/2016 1439   CL 105 12/22/2015 1628   CO2 27 08/18/2016 1439   BUN 12.2 08/18/2016 1439   CREATININE 0.7 08/18/2016 1439      Component Value Date/Time   CALCIUM 9.5 08/18/2016 1439   ALKPHOS 94 08/18/2016 1439   AST 21 08/18/2016 1439   ALT 14 08/18/2016 1439   BILITOT 0.57 08/18/2016 1439       No results found for: LABCA2  No components found for: LABCA125  No results for input(s): INR in the last 168 hours.  Urinalysis    Component Value Date/Time   COLORURINE YELLOW 11/13/2012 1059   APPEARANCEUR CLEAR 11/13/2012 1059   LABSPEC 1.010 11/13/2012 1059   PHURINE 8.5 (H) 11/13/2012 1059   GLUCOSEU NEGATIVE 11/13/2012 1059   HGBUR NEGATIVE 11/13/2012 1059   BILIRUBINUR NEGATIVE 11/13/2012 1059   KETONESUR NEGATIVE 11/13/2012 1059   PROTEINUR NEGATIVE 11/13/2012  1059   UROBILINOGEN 0.2 11/13/2012 1059   NITRITE NEGATIVE 11/13/2012 1059   LEUKOCYTESUR TRACE (A) 11/13/2012 1059     STUDIES: Ct Chest W Contrast  Result Date: 08/18/2016 CLINICAL DATA:  61 year old female with history of right-sided breast cancer diagnosed 2 months ago in [place]. Status post lumpectomy and radiation therapy. EXAM: CT CHEST WITH CONTRAST TECHNIQUE: Multidetector CT imaging of the chest was performed during intravenous contrast administration. CONTRAST:  74mL ISOVUE-300 IOPAMIDOL (ISOVUE-300) INJECTION 61% COMPARISON:  No priors. FINDINGS: Cardiovascular: Heart size is normal. There is no significant pericardial fluid, thickening or pericardial calcification. Aortic atherosclerosis. No definite calcifications are identified within the coronary arteries. Mediastinum/Nodes: No pathologically enlarged mediastinal or hilar lymph nodes. Esophagus is unremarkable in appearance. No axillary lymphadenopathy. However, there are several right axillary lymph nodes measuring up to at 8 mm in size which are conspicuous but nonspecific. Lungs/Pleura: Linear scarring in the right middle lobe. No suspicious appearing pulmonary nodules or masses are noted. No acute consolidative airspace disease. No pleural effusions. Upper Abdomen: Aortic atherosclerosis. Musculoskeletal: Postoperative changes of lumpectomy are noted in the lateral aspect of the right breast. There is a postoperative area of architectural distortion in this region. There are no aggressive appearing lytic or blastic lesions noted in the visualized portions of the skeleton. IMPRESSION: 1. Postoperative changes of right breast lumpectomy. Multiple borderline enlarged right axillary lymph nodes measuring up to 8 mm in short axis are nonspecific and may be reactive. No other definite signs to suggest metastatic disease in the thorax. 2. Aortic atherosclerosis. Electronically Signed   By: Vinnie Langton M.D.   On: 08/18/2016 19:25     ELIGIBLE FOR AVAILABLE RESEARCH PROTOCOL: No  ASSESSMENT: 61 y.o. [place] Woman, originally from [place], status post right breast upper outer quadrant lumpectomy July 2017 for a pT1c pNX, stage IA invasive ductal carcinoma,  grade 3, prognostic panel not available  (1) complex cystic mass in the right breast and suspicious right axillary lymph node both biopsied 01/25/2016, showing no evidence of malignancy. Multiple cultures were negative as well   (2) adjuvant radiation 03/23/2016 to 05/15/2015: Site/dose:   The patient initially received a dose of 50.4 Gy in 28 fractions to the breast using whole-breast tangent fields. This was delivered using a 3-D conformal technique. The patient then received a boost to the seroma. This delivered an additional 14 Gy in 7 fractions using an isodose plan with energy 10X and 6X. The total dose was 64.4 Gy.  (  3) started anastrozole 06/05/2016  PLAN: Adalaide is now just about a year out from definitive surgery for her breast cancer with no evidence of disease recurrence. This is very favorable.  She is tolerating the anastrozole well. The plan will be to continue that for a total of 5 years.  I reassured her that the discomfort she is feeling in her breast area is expected and normal. It may resolve and may recur. It does not mean that she has breast cancer in that area.  We reviewed her CT of the chest and her lab work in detail. She was very reassured by these results. She understands that scans and labs cannot find microscopic cancer and therefore I cannot say she is cured. What I do say is that she is in remission and that she has a good chance of staying in remission indefinitely which is what a cure is deceased days.  She will be going back to [place] in September and she will see me again in August. We will do physical exam and lab work only at that time     Kelly Cruel, MD   08/25/2016 3:47 PM Medical Oncology and Hematology [facility] [place], [place] [address] Tel. [phone]    Fax. [phone]  Addendum: Mckinzee brought me a lovely ceramic of a hand. I wanted to make sure to thank her next visit.

## 2016-11-27 ENCOUNTER — Encounter: Payer: No Typology Code available for payment source | Admitting: Adult Health

## 2016-12-02 MED FILL — ANASTROZOLE 1 MG TABLET: 1 | 30 days supply | Qty: 30 | Fill #1

## 2016-12-03 ENCOUNTER — Ambulatory Visit (HOSPITAL_BASED_OUTPATIENT_CLINIC_OR_DEPARTMENT_OTHER): Payer: Self-pay | Admitting: Adult Health

## 2016-12-03 ENCOUNTER — Other Ambulatory Visit: Payer: Self-pay | Admitting: Emergency Medicine

## 2016-12-03 ENCOUNTER — Encounter: Payer: Self-pay | Admitting: Adult Health

## 2016-12-03 VITALS — BP 132/78 | HR 63 | Temp 98.2°F | Resp 17 | Ht 63.0 in | Wt 156.0 lb

## 2016-12-03 DIAGNOSIS — E2839 Other primary ovarian failure: Secondary | ICD-10-CM

## 2016-12-03 DIAGNOSIS — Z170 Estrogen receptor positive status [ER+]: Secondary | ICD-10-CM

## 2016-12-03 DIAGNOSIS — Z79811 Long term (current) use of aromatase inhibitors: Secondary | ICD-10-CM

## 2016-12-03 DIAGNOSIS — C50411 Malignant neoplasm of upper-outer quadrant of right female breast: Secondary | ICD-10-CM

## 2016-12-03 MED ORDER — ANASTROZOLE 1 MG PO TABS: 1.0000 mg | ORAL_TABLET | Freq: Every day | ORAL | 4 refills | Status: DC

## 2016-12-03 NOTE — Progress Notes (Signed)
CLINIC:  Survivorship   REASON FOR VISIT:  Routine follow-up post-treatment for a recent history of breast cancer.  BRIEF ONCOLOGIC HISTORY:    Malignant neoplasm of upper-outer quadrant of right breast in female, estrogen receptor positive (Kelly Yu)   11/2015 Initial Diagnosis    Malignant neoplasm of upper-outer quadrant of right breast in female, estrogen receptor positive (Kelly Yu)      11/2015 Surgery    Right upper outer quadrant lumpectomy: T1c, Nx, grade 3, no prognostic profile available (surgery done in [place]).        03/23/2016 - 05/14/2016 Radiation Therapy    Right breast: 50.4Gy in 28 fractions, Boost: 14 Gy in 7 fractions      06/2016 -  Anti-estrogen oral therapy    Anastrozole       INTERVAL HISTORY:  Kelly Yu presents to the [facility] today for our initial meeting to review her survivorship care plan detailing her treatment course for breast cancer, as well as monitoring long-term side effects of that treatment, education regarding health maintenance, screening, and overall wellness and health promotion.     Kelly Yu is here accompanied by an interpreter and her daughter.  She is doing well.  She is taking Anastrozole daily and is tolerating it well.  She does have occasional hot flashes that are manageable.      REVIEW OF SYSTEMS:  Review of Systems  Constitutional: Negative for appetite change, chills, fatigue, fever and unexpected weight change.  HENT:   Negative for hearing loss and lump/mass.   Eyes: Negative for eye problems and icterus.  Respiratory: Negative for chest tightness, cough and shortness of breath.   Cardiovascular: Negative for chest pain, leg swelling and palpitations.  Gastrointestinal: Negative for abdominal distention and abdominal pain.  Endocrine: Negative for hot flashes.  Genitourinary: Negative for difficulty urinating.   Musculoskeletal: Negative for arthralgias.  Skin: Negative for itching and rash.    Neurological: Negative for dizziness, extremity weakness, headaches and numbness.  Hematological: Negative for adenopathy. Does not bruise/bleed easily.  Psychiatric/Behavioral: Negative for depression. The patient is not nervous/anxious.   Breast: Denies any new nodularity, masses, tenderness, nipple changes, or nipple discharge.      ONCOLOGY TREATMENT TEAM:  1. Surgeon:  Dr. Donne Hazel at [facility] 2. Medical Oncologist: Dr. Jana Hakim  3. Radiation Oncologist: Dr. Lisbeth Renshaw    PAST MEDICAL/SURGICAL HISTORY:  Past Medical History:  Diagnosis Date  . Breast cancer ([place])    Diagnosed in [place] in July 2017, Stage IA, T1c, Nx, Mx invasive ductal carcinoma  . Ulcer    Past Surgical History:  Procedure Laterality Date  . right breast lumpectomy       ALLERGIES:  No Known Allergies   CURRENT MEDICATIONS:  Outpatient Encounter Prescriptions as of 12/03/2016  Medication Sig  . anastrozole (ARIMIDEX) 1 MG tablet Take 1 tablet (1 mg total) by mouth daily.  . [DISCONTINUED] hyaluronate sodium (RADIAPLEXRX) GEL Apply 1 application topically once.  . [DISCONTINUED] non-metallic deodorant Kelly Yu) MISC Apply 1 application topically daily as needed.   No facility-administered encounter medications on file as of 12/03/2016.      ONCOLOGIC FAMILY HISTORY:  Family History  Problem Relation Age of Onset  . Diabetes Sister      GENETIC COUNSELING/TESTING: Not indicated at this time  SOCIAL HISTORY:  Kelly Yu is lives in [place] and got a Visa here for her cancer treatment.  She is returning to [place] 01/24/2017 and will return for her follow up appointment.  She denies any current or history of tobacco, alcohol, or illicit drug use.     PHYSICAL EXAMINATION:  Vital Signs:   Vitals:   12/03/16 1016  BP: 132/78  Pulse: 63  Resp: 17  Temp: 98.2 F (36.8 C)   Filed Weights   12/03/16 1016  Weight: 156 lb (70.8 kg)   General: Well-nourished, well-appearing  female in no acute distress.  She is accompanied by her daughter and an interpreter today.   HEENT: Head is normocephalic.  Pupils equal and reactive to light. Conjunctivae clear without exudate.  Sclerae anicteric. Oral mucosa is pink, moist.  Oropharynx is pink without lesions or erythema.  Lymph: No cervical, supraclavicular, or infraclavicular lymphadenopathy noted on palpation.  Cardiovascular: Regular rate and rhythm.Marland Kitchen Respiratory: Clear to auscultation bilaterally. Chest expansion symmetric; breathing non-labored.  GI: Abdomen soft and round; non-tender, non-distended. Bowel sounds normoactive.  GU: Deferred.  Neuro: No focal deficits. Steady gait.  Psych: Mood and affect normal and appropriate for situation.  Extremities: No edema. MSK: No focal spinal tenderness to palpation.  Full range of motion in bilateral upper extremities Skin: Warm and dry.  LABORATORY DATA:  None for this visit.  DIAGNOSTIC IMAGING:  None for this visit.      ASSESSMENT AND PLAN:  Kelly Yu is a pleasant 61 y.o. female with Stage IA right breast invasive ductal carcinoma, prognositc profile unknown, diagnosed in 11/2015, treated with lumpectomy, adjuvant radiation therapy, and anti-estrogen therapy with Anastrozole beginning in 06/2016.  She presents to the Survivorship Clinic for our initial meeting and routine follow-up post-completion of treatment for breast cancer.    1. Stage IA right breast cancer:  Kelly Yu is continuing to recover from definitive treatment for breast cancer. She will follow-up with her medical oncologist, Dr. Jana Hakim later this month with history and physical exam per surveillance protocol.  She will continue her anti-estrogen therapy with Anastrozole. Thus far, she is tolerating the Anastrozole well, with minimal side effects.  Today, a comprehensive survivorship care plan and treatment summary was reviewed with the patient today detailing her breast cancer diagnosis, treatment  course, potential late/long-term effects of treatment, appropriate follow-up care with recommendations for the future, and patient education resources.  A copy of this summary, along with a letter will be sent to the patient's primary care provider via mail/fax/In Basket message after today's visit.    2. Bone health:  Given Kelly Yu's age/history of breast cancer and her current treatment regimen including anti-estrogen therapy with Anastrozole, she is at risk for bone demineralization.  I ordered a DEXA for around the same time her mammogram is due.  In the meantime, she was encouraged to increase her consumption of foods rich in calcium, as well as increase her weight-bearing activities.  She was given education on specific activities to promote bone health.  3. Cancer screening:  Due to Kelly Yu's history and her age, she should receive screening for skin cancers, colon cancer, and gynecologic cancers.  The information and recommendations are listed on the patient's comprehensive care plan/treatment summary and were reviewed in detail with the patient.    4. Health maintenance and wellness promotion: Kelly Yu was encouraged to consume 5-7 servings of fruits and vegetables per day. We reviewed the "Nutrition Rainbow" handout, as well as the handout "Take Control of Your Health and Reduce Your Cancer Risk" from the [place].  She was also encouraged to engage in moderate to vigorous exercise for 30 minutes per day most  days of the week. We discussed the [employer] fitness program, which is designed for cancer survivors to help them become more physically fit after cancer treatments.  She was instructed to limit her alcohol consumption and continue to abstain from tobacco use.     5. Support services/counseling: It is not uncommon for this period of the patient's cancer care trajectory to be one of many emotions and stressors.  We discussed an opportunity for her to participate  in the next session of [facility] ("Finding Your New Normal") support group series designed for patients after they have completed treatment.   Kelly Yu was encouraged to take advantage of our many other support services programs, support groups, and/or counseling in coping with her new life as a cancer survivor after completing anti-cancer treatment.  She was offered support today through active listening and expressive supportive counseling.  She was given information regarding our available services and encouraged to contact me with any questions or for help enrolling in any of our support group/programs.    Dispo:   -Return to cancer center later this month for follow up with Dr. Jana Hakim  -Mammogram due in 01/2017 -Bone Density at same time as mammogram -She is welcome to return back to the Survivorship Clinic at any time; no additional follow-up needed at this time.  -Consider referral back to survivorship as a long-term survivor for continued surveillance  A total of (45) minutes of face-to-face time was spent with this patient with greater than 50% of that time in counseling and care-coordination.   Gardenia Phlegm, NP Survivorship Program [place] [phone]   Note: PRIMARY CARE PROVIDER Patient, No Pcp Per None None

## 2016-12-08 ENCOUNTER — Telehealth: Payer: Self-pay | Admitting: Emergency Medicine

## 2016-12-08 ENCOUNTER — Ambulatory Visit
Admission: RE | Admit: 2016-12-08 | Discharge: 2016-12-08 | Disposition: A | Discharge status: Home / Self Care | Payer: No Typology Code available for payment source | Source: Ambulatory Visit | Attending: Adult Health | Admitting: Adult Health

## 2016-12-08 DIAGNOSIS — E2839 Other primary ovarian failure: Secondary | ICD-10-CM

## 2016-12-08 NOTE — Telephone Encounter (Signed)
Called bone density results to patients daughter (patient does not speak english). Per Kelly Yu patient is to take vit d supplements and calcium. Also start weight bearing exercises and repeat scan in 2 years. Daughter verbalized understanding.

## 2016-12-10 ENCOUNTER — Other Ambulatory Visit: Payer: Self-pay | Admitting: Adult Health

## 2016-12-10 ENCOUNTER — Other Ambulatory Visit: Payer: Self-pay | Admitting: Obstetrics and Gynecology

## 2016-12-10 DIAGNOSIS — Z170 Estrogen receptor positive status [ER+]: Principal | ICD-10-CM

## 2016-12-10 DIAGNOSIS — C50411 Malignant neoplasm of upper-outer quadrant of right female breast: Secondary | ICD-10-CM

## 2016-12-10 DIAGNOSIS — Z1231 Encounter for screening mammogram for malignant neoplasm of breast: Secondary | ICD-10-CM

## 2016-12-15 ENCOUNTER — Other Ambulatory Visit (HOSPITAL_BASED_OUTPATIENT_CLINIC_OR_DEPARTMENT_OTHER): Payer: Self-pay

## 2016-12-15 DIAGNOSIS — C50411 Malignant neoplasm of upper-outer quadrant of right female breast: Secondary | ICD-10-CM

## 2016-12-15 LAB — COMPREHENSIVE METABOLIC PANEL
ALBUMIN: 3.7 g/dL (ref 3.5–5.0)
ALK PHOS: 103 U/L (ref 40–150)
ALT: 16 U/L (ref 0–55)
ANION GAP: 9 meq/L (ref 3–11)
AST: 21 U/L (ref 5–34)
BILIRUBIN TOTAL: 0.36 mg/dL (ref 0.20–1.20)
BUN: 17.1 mg/dL (ref 7.0–26.0)
CALCIUM: 9.3 mg/dL (ref 8.4–10.4)
CO2: 27 mEq/L (ref 22–29)
Chloride: 106 mEq/L (ref 98–109)
Creatinine: 0.8 mg/dL (ref 0.6–1.1)
EGFR: 82 mL/min/{1.73_m2} — AB (ref >90)
GLUCOSE: 124 mg/dL (ref 70–140)
POTASSIUM: 4.1 meq/L (ref 3.5–5.1)
Sodium: 141 mEq/L (ref 136–145)
TOTAL PROTEIN: 7.5 g/dL (ref 6.4–8.3)

## 2016-12-15 LAB — CBC WITH DIFFERENTIAL/PLATELET
BASO%: 0.8 % (ref 0.0–2.0)
BASOS ABS: 0.1 10*3/uL (ref 0.0–0.1)
EOS ABS: 0.2 10*3/uL (ref 0.0–0.5)
EOS%: 2.9 % (ref 0.0–7.0)
HEMATOCRIT: 34.7 % — AB (ref 34.8–46.6)
HEMOGLOBIN: 11.7 g/dL (ref 11.6–15.9)
LYMPH%: 26.6 % (ref 14.0–49.7)
MCH: 29.5 pg (ref 25.1–34.0)
MCHC: 33.7 g/dL (ref 31.5–36.0)
MCV: 87.7 fL (ref 79.5–101.0)
MONO#: 0.6 10*3/uL (ref 0.1–0.9)
MONO%: 8.9 % (ref 0.0–14.0)
NEUT%: 60.8 % (ref 38.4–76.8)
NEUTROS ABS: 4.3 10*3/uL (ref 1.5–6.5)
Platelets: 202 10*3/uL (ref 145–400)
RBC: 3.96 10*6/uL (ref 3.70–5.45)
RDW: 14.2 % (ref 11.2–14.5)
WBC: 7.1 10*3/uL (ref 3.9–10.3)
lymph#: 1.9 10*3/uL (ref 0.9–3.3)

## 2016-12-17 ENCOUNTER — Encounter: Payer: Self-pay | Admitting: Oncology

## 2016-12-17 ENCOUNTER — Ambulatory Visit (HOSPITAL_BASED_OUTPATIENT_CLINIC_OR_DEPARTMENT_OTHER): Payer: Self-pay | Admitting: Oncology

## 2016-12-17 VITALS — BP 142/72 | HR 65 | Temp 98.5°F | Resp 18 | Ht 63.0 in | Wt 159.4 lb

## 2016-12-17 DIAGNOSIS — Z170 Estrogen receptor positive status [ER+]: Secondary | ICD-10-CM

## 2016-12-17 DIAGNOSIS — M8580 Other specified disorders of bone density and structure, unspecified site: Secondary | ICD-10-CM

## 2016-12-17 DIAGNOSIS — Z79811 Long term (current) use of aromatase inhibitors: Secondary | ICD-10-CM

## 2016-12-17 DIAGNOSIS — C50411 Malignant neoplasm of upper-outer quadrant of right female breast: Secondary | ICD-10-CM

## 2016-12-17 MED ORDER — ANASTROZOLE 1 MG PO TABS: 1.0000 mg | ORAL_TABLET | Freq: Every day | ORAL | 4 refills | Status: DC

## 2016-12-17 MED ORDER — VITAMIN D 1000 UNITS PO TABS: 1000.0000 [IU] | ORAL_TABLET | Freq: Every day | ORAL | 4 refills | Status: AC

## 2016-12-17 NOTE — Progress Notes (Signed)
Her 61 I'll plan will be  [place]  [phone] [phone]     ID: Kelly Yu DOB: [birth year]  MR#: [id number]  XIP#:[id number]  Patient Care Team: Patient, No Pcp Per as PCP - General (General Practice) Melonee Gerstel, Virgie Dad, MD as Consulting Physician (Oncology) Rolm Bookbinder, MD as Consulting Physician (General Surgery) Kyung Rudd, MD as Consulting Physician (Radiation Oncology) Delice Bison, Charlestine Massed, NP as Nurse Practitioner (Hematology and Oncology) Chauncey Cruel, MD OTHER MD:  CHIEF COMPLAINT: Invasive ductal carcinoma, unknown prognostic panel  CURRENT TREATMENT: anastrozole   BREAST CANCER HISTORY: On the original intake note:  Aleira tells me she had pain in the right breast, which brought her to a physician in [place], where she normally resides. They obtained a mammogram, but she does not know those results. 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 There is no prognostic panel  More recently, while visiting family in [place], the patient presented to the emergency department with a complaint of right breast pain and redness. She was afebrile. Needle aspiration was obtained and the patient was started on Augmentin. The aspirate cultures remain negative.  On 12/22/2015 she had a right breast ultrasound at the [facility]. This showed, at the 11:00 position of the right breast, 8 cm from the nipple, a large complex cystic region measuring up to 8.5 cm. There was no hyperemia.   The patient was referred to surgery, and on 01/18/2016 she had bilateral diagnostic mammography with tomography [facility]. The breast density was category B. In the upper outer right breast  there was a 5.7 cm mass, with an associated 1 cm satellite. There was a large firm mass deforming the contour of the breast and targeted ultrasound demonstrated a large complex collection in the upper outer quadrant of the breast measuring up to 4.8 cm. A borderline lymph node with a moderately thickened cortex was visualized in the right axilla.  Biopsy of the right breast mass and right axillary lymph node 01/25/2016 showed ([id number]) no evidence of malignancy. Cultures from the 01/25/2016 biopsy all remained negative.  She was referred to radiation oncology to receive adjuvant radiation. Her subsequent history is as detailed below  INTERVAL HISTORY: Shahana returns today for follow-up of her estrogen receptor positive breast cancer accompanied by her daughter and by a Optometrist. Jisell was in [place] until recently. She has continued on anastrozole, with good tolerance.Hot flashes and vaginal dryness are not a major issue. She never developed the arthralgias or myalgias that many patients can experience on this medication. She obtains it at a good price.  We obtained a CT scan of the chest in April, which showed no evidence of metastatic disease. There were some small slightly prominent right axillary adenopathy which merits follow-up.  REVIEW OF SYSTEMS: Akyra does housework and cooking and generally helps around the house. She is not exercising regularly. Sometimes she has pain in the surgical side, a little bit below the right breast. This is very intermittent, occurs maybe every 2 weeks, and results without any intervention. She has a little bit of low back pain which is chronic, not more intense or persistent than before. A detailed review of systems today was otherwise stable.  PAST MEDICAL HISTORY: Past Medical History:  Diagnosis Date  .  Breast cancer ([place])    Diagnosed in [place] in July 2017, Stage IA, T1c, Nx, Mx invasive ductal carcinoma  . Ulcer     PAST SURGICAL  HISTORY: Past Surgical History:  Procedure Laterality Date  . right breast lumpectomy      FAMILY HISTORY Family History  Problem Relation Age of Onset  . Diabetes Sister   The patient's father died from renal failure in his 18s the patient's mother died from a blood clot in her 32s. The patient had 4 brothers, 8 sisters. There is no history of breast or ovarian cancer in the family to the patient's knowledge  GYNECOLOGIC HISTORY:  No LMP recorded. Patient is postmenopausal. Menarche age 53, first live birth age 44, the patient is GX B8. She is now postmenopausal. She never took hormone replacement.  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 While visiting in the [place] she lives with her daughter [name]. 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 Also at home are the patient's son-in-law [name] , who works for [employer], and their 3 children age 52, 15, and 29    ADVANCED DIRECTIVES: The patient's daughter [name] and son-in-law [name] are jointly the patient's healthcare power of attorney    HEALTH MAINTENANCE: Social History  Substance Use Topics  . Smoking status: Never Smoker  . Smokeless tobacco: Never Used  . Alcohol use No     Colonoscopy:  PAP: 01/18/2016 / benign   Bone density:   No Known Allergies  Current Outpatient Prescriptions  Medication Sig Dispense Refill  . anastrozole (ARIMIDEX) 1 MG tablet Take 1 tablet (1 mg total) by mouth daily. 90 tablet 4  . cholecalciferol (VITAMIN D) 1000 units tablet Take 1 tablet (1,000 Units total) by mouth daily. 90 tablet 4   No current facility-administered medications for this visit.     OBJECTIVE: Middle-aged middle [place] woman Who appears well   Vitals:   12/17/61 1525  BP: (!) 142/72  Pulse: 65  Resp: 18  Temp: 98.5 F (36.9 C)  SpO2: 100%     Body mass index is 28.24 kg/m.    ECOG FS:1 - Symptomatic but completely ambulatory  Sclerae unicteric,  pupils round and equal Oropharynx clear and moist No cervical or supraclavicular adenopathy Lungs no rales or rhonchi Heart regular rate and rhythm Abd soft, nontender, positive bowel sounds MSK no focal spinal tenderness, no upper extremity lymphedema Neuro: nonfocal, well oriented, appropriate affect Breasts: The right breast has undergone lumpectomy followed by radiation with no evidence of disease recurrence. The left breast is benign. Both axillae are benign.  LAB RESULTS:  CMP     Component Value Date/Time   NA 141 12/15/2016 1606   K 4.1 12/15/2016 1606   CL 105 12/22/2015 1628   CO2 27 12/15/2016 1606   GLUCOSE 124 12/15/2016 1606   BUN 17.1 12/15/2016 1606   CREATININE 0.8 12/15/2016 1606   CALCIUM 9.3 12/15/2016 1606   PROT 7.5 12/15/2016 1606   ALBUMIN 3.7 12/15/2016 1606   AST 21 12/15/2016 1606   ALT 16 12/15/2016 1606   ALKPHOS 103 12/15/2016 1606   BILITOT 0.36 12/15/2016 1606   GFRNONAA >60 12/22/2015 1628   GFRAA >60 12/22/2015 1628    INo results found for: SPEP, UPEP  Lab Results  Component Value Date   WBC 7.1 12/15/2016   NEUTROABS 4.3 12/15/2016   HGB 11.7 12/15/2016   HCT 34.7 (L) 12/15/2016  MCV 87.7 12/15/2016   PLT 202 12/15/2016      Chemistry      Component Value Date/Time   NA 141 12/15/2016 1606   K 4.1 12/15/2016 1606   CL 105 12/22/2015 1628   CO2 27 12/15/2016 1606   BUN 17.1 12/15/2016 1606   CREATININE 0.8 12/15/2016 1606      Component Value Date/Time   CALCIUM 9.3 12/15/2016 1606   ALKPHOS 103 12/15/2016 1606   AST 21 12/15/2016 1606   ALT 16 12/15/2016 1606   BILITOT 0.36 12/15/2016 1606       No results found for: LABCA2  No components found for: LABCA125  No results for input(s): INR in the last 168 hours.  Urinalysis    Component Value Date/Time   COLORURINE YELLOW 11/13/2012 1059   APPEARANCEUR CLEAR 11/13/2012 1059   LABSPEC 1.010 11/13/2012 1059   PHURINE 8.5 (H) 11/13/2012 1059   GLUCOSEU  NEGATIVE 11/13/2012 1059   HGBUR NEGATIVE 11/13/2012 1059   BILIRUBINUR NEGATIVE 11/13/2012 1059   KETONESUR NEGATIVE 11/13/2012 1059   PROTEINUR NEGATIVE 11/13/2012 1059   UROBILINOGEN 0.2 11/13/2012 1059   NITRITE NEGATIVE 11/13/2012 1059   LEUKOCYTESUR TRACE (A) 11/13/2012 1059     STUDIES: Chest CT scan 08/18/2016 found postoperative changes only, with some conspicuous right axillary adenopathy measuring up to 0.8 cm, felt to be nonspecific and likely reactive  Dg Bone Density  Result Date: 12/08/2016 EXAM: DUAL X-RAY ABSORPTIOMETRY (DXA) FOR BONE MINERAL DENSITY IMPRESSION: Referring Physician:  Gardenia Phlegm PATIENT: Name: Aileen, Amore Patient ID: [id number] Birth Date: [birth year] Height: 61.8 in. Sex: Female Measured: 12/08/2016 Weight: 157.8 lbs. Indications: Anastrazole, Breast Cancer History, Estrogen Deficient, Postmenopausal Fractures: None Treatments: Hormone Therapy For Cancer ASSESSMENT: The BMD measured at AP Spine L1-L4 is 1.005 g/cm2 with a T-score of -1.5. This patient is considered osteopenic according to [facility]) criteria. Site Region Measured Date Measured Age YA BMD Significant CHANGE T-score AP Spine  L1-L4      12/08/2016    62.9         -1.5    1.005 g/cm2 DualFemur Neck Right 12/08/2016    62.9         -0.9    0.911 g/cm2 World Health Organization [facility]) criteria for post-menopausal, Caucasian Women: Normal       T-score at or above -1 SD Osteopenia   T-score between -1 and -2.5 SD Osteoporosis T-score at or below -2.5 SD RECOMMENDATION: [place] recommends that FDA-approved medical therapies be considered in postmenopausal women and men age 59 or older with a: 1. Hip or vertebral (clinical or morphometric) fracture. 2. T-score of <-2.5 at the spine or hip. 3. Ten-year fracture probability by FRAX of 3% or greater for hip fracture or 20% or greater for major osteoporotic fracture. All treatment decisions require  clinical judgment and consideration of individual patient factors, including patient preferences, co-morbidities, previous drug use, risk factors not captured in the FRAX model (e.g. falls, vitamin D deficiency, increased bone turnover, interval significant decline in bone density) and possible under - or over-estimation of fracture risk by FRAX. All patients should ensure an adequate intake of dietary calcium (1200 mg/d) and vitamin D (800 IU daily) unless contraindicated. FOLLOW-UP: People with diagnosed cases of osteoporosis or at high risk for fracture should have regular bone mineral density tests. For patients eligible for Medicare, routine testing is allowed once every 2 years. The testing frequency can be increased to one year for  patients who have rapidly progressing disease, those who are receiving or discontinuing medical therapy to restore bone mass, or have additional risk factors. I have reviewed this report, and agree with the above findings. Industry Radiology FRAX* 10-year Probability of Fracture Based on femoral neck BMD: DualFemur (Right) Major Osteoporotic Fracture: 7.4% Hip Fracture:                0.4% Population:                  [place] (Caucasian) Risk Factors:                None *FRAX is a Materials engineer of the [employer] of [employer] for Metabolic Bone Disease, a World Pharmacologist (WHO) [facility]. ASSESSMENT: The probability of a major osteoporotic fracture is 7.4 % within the next ten years. The probability of a hip fracture is 0.4 % within the next ten years. Electronically Signed   By: Van Clines M.D.   On: 12/08/2016 08:29    ELIGIBLE FOR AVAILABLE RESEARCH PROTOCOL: No  ASSESSMENT: 61 y.o. [place] Woman, originally from [place], status post right breast upper outer quadrant lumpectomy July 2017 for a pT1c pNX, stage IA invasive ductal carcinoma,  grade 3, prognostic panel not available  (1) complex cystic mass in the right breast  and suspicious right axillary lymph node both biopsied 01/25/2016, showing no evidence of malignancy. Multiple cultures were negative as well   (2) adjuvant radiation 03/23/2016 to 05/15/2015: Site/dose:   The patient initially received a dose of 50.4 Gy in 28 fractions to the breast using whole-breast tangent fields. This was delivered using a 3-D conformal technique. The patient then received a boost to the seroma. This delivered an additional 14 Gy in 7 fractions using an isodose plan with energy 10X and 6X. The total dose was 64.4 Gy.  (3) started anastrozole 06/05/2016  (a) bone density at the [facility] 12/08/2016 finds a T score of -1.5  PLAN: I spent a little over 30 minutes with the patient and her family going over her situation. We had a translator with [place] which was very helpful. Nevertheless communication was slow and many questions had to be repeated many times.   Yulonda is now a year out from definitive surgery for her breast cancer with no evidence of disease recurrence. This is favorable.  She continues to tolerate anastrozole well. The plan will be to continue that for 5 years.  I have added right axillary ultrasound to the upcoming mammography to evaluate the nonspecific adenopathy noted in the recent chest CT scan. I do not expect any bad news there.  Her bone density shows osteopenia. We discussed the need for her to walk 4-5 km a day and I also wrote her for vitamin D 1000 units to take daily. We will repeat the bone density in 2 years.  The patient requested a letter which I was glad to write documenting that she is being treated here for her breast cancer  The patient's daughter is interested in bringing a relative was liver cancer to the states to be treated. I explained that I'm going to need the person's complete name, date of birth, a letter documenting the pathology results, and a letter from his [place] physician stating that the treatments that he needs is not  available MRI. With that information I will be glad to write a letter for them that may be helpful in getting them to the [place].  Otherwise the patient will return to see me in February. She knows to call for any problems that may develop before that visit.     Chauncey Cruel, MD   12/17/2016 8:24 PM Medical Oncology and Hematology [facility] [address] [place], [place] [address] Tel. [phone]    Fax. [phone]

## 2016-12-24 IMAGING — US US BREAST*R* LIMITED INC AXILLA
1 series · 14 of 21 positions shown · non-contrast
Comparison: None.

CLINICAL DATA: Assess right breast abscess. Erythema at the right
breast. Initial encounter.

EXAM:
ULTRASOUND OF THE RIGHT BREAST

[Series 1: us breast*right* limited inc axilla · 0.07mm/px · 14 of 21 slices shown]
[im 1/21]
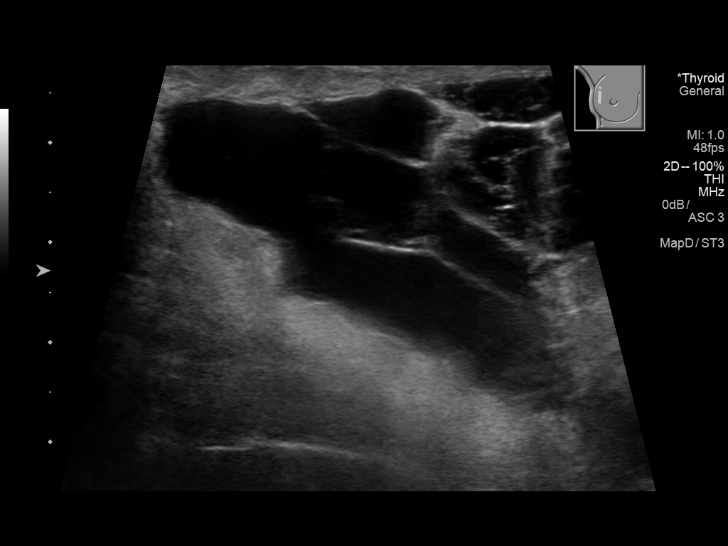
[im 3/21]
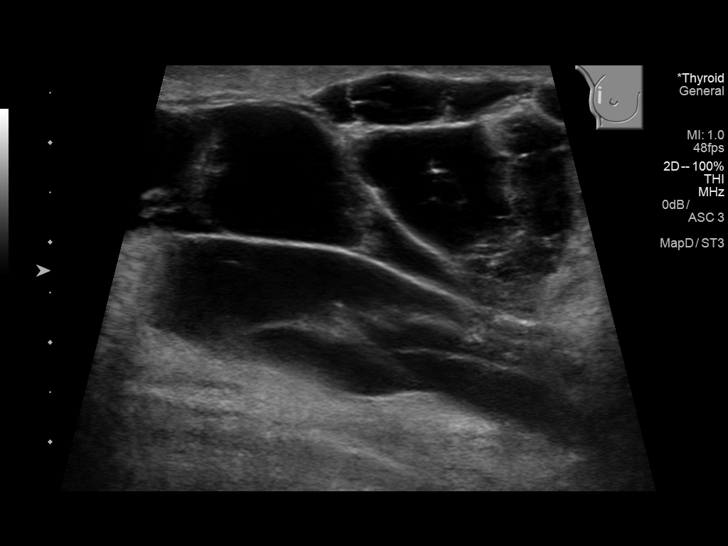
[im 4/21]
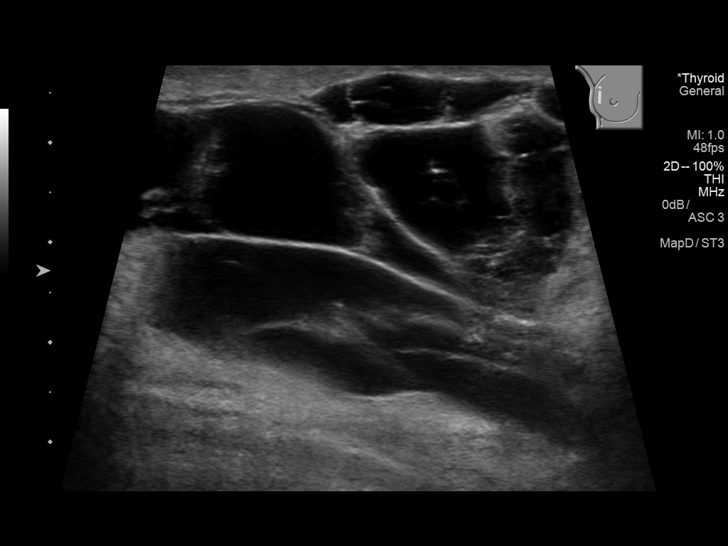
[im 6/21]
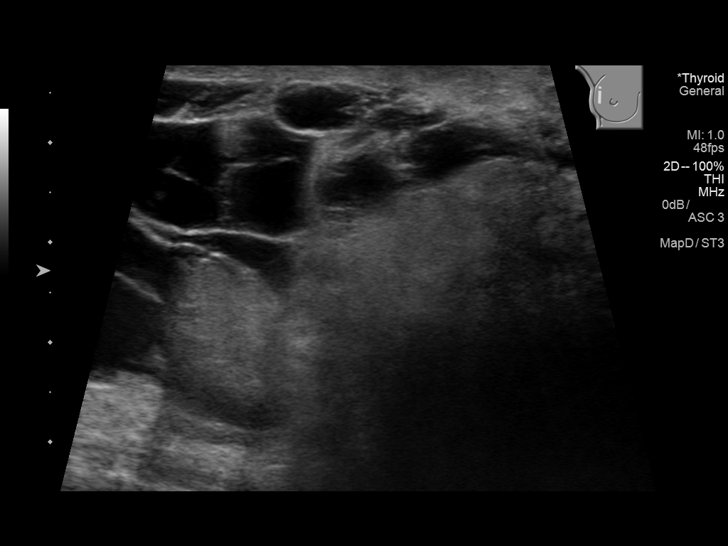
[im 7/21]
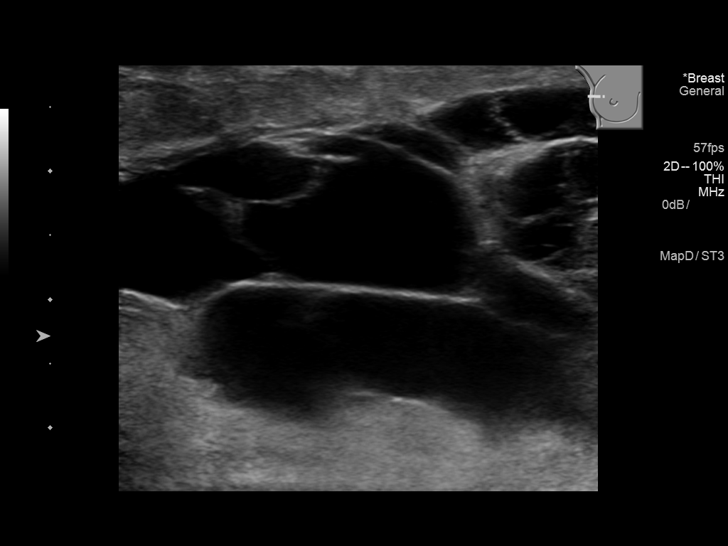
[im 9/21]
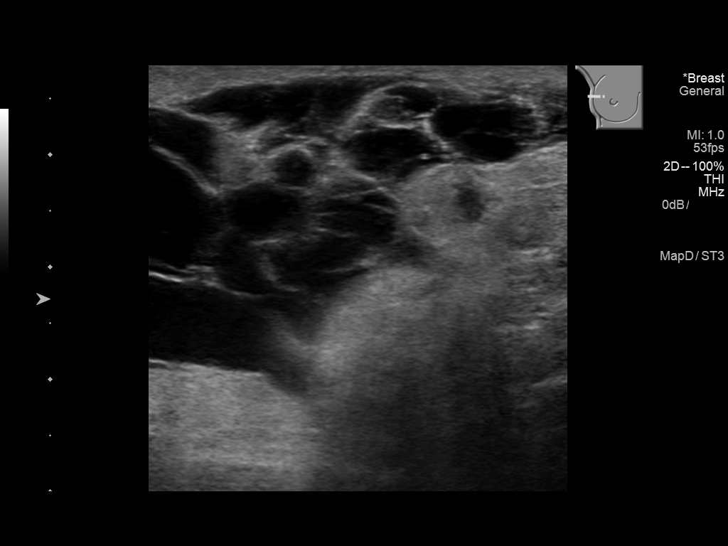
[im 10/21]
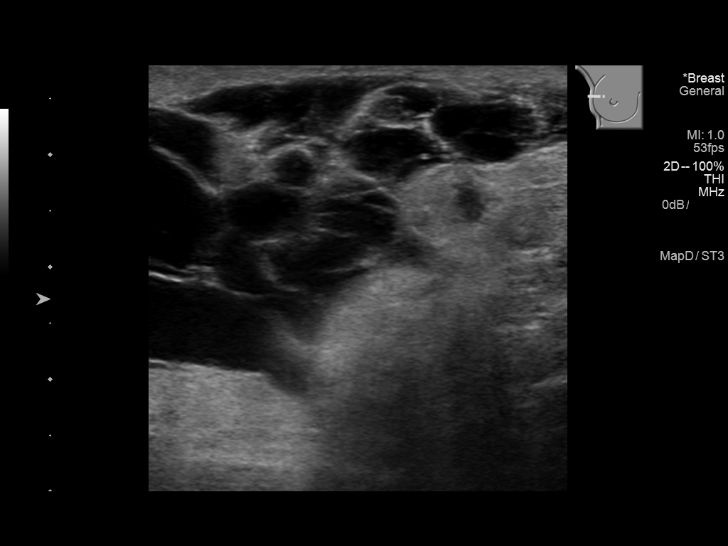
[im 12/21]
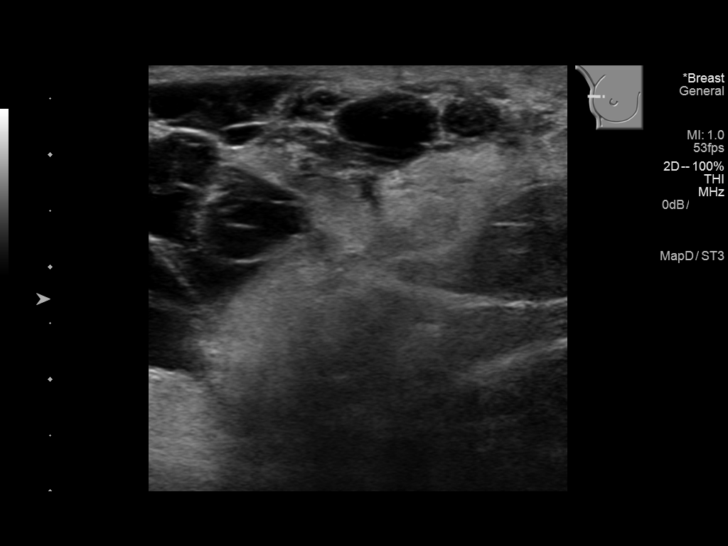
[im 13/21]
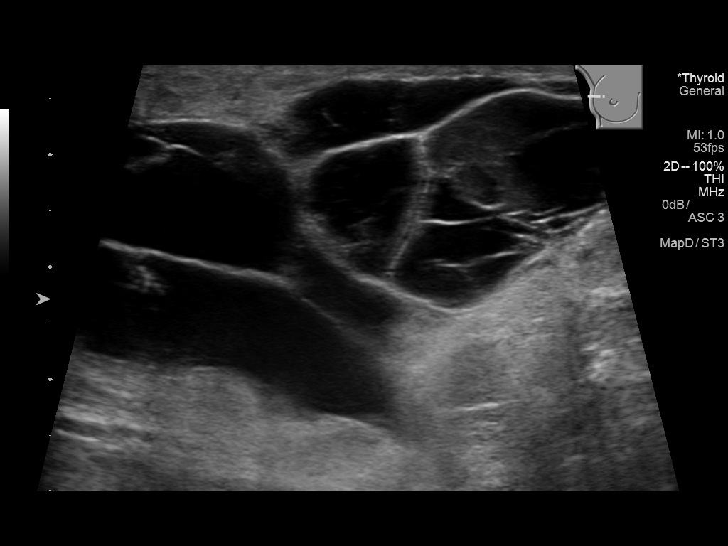
[im 15/21]
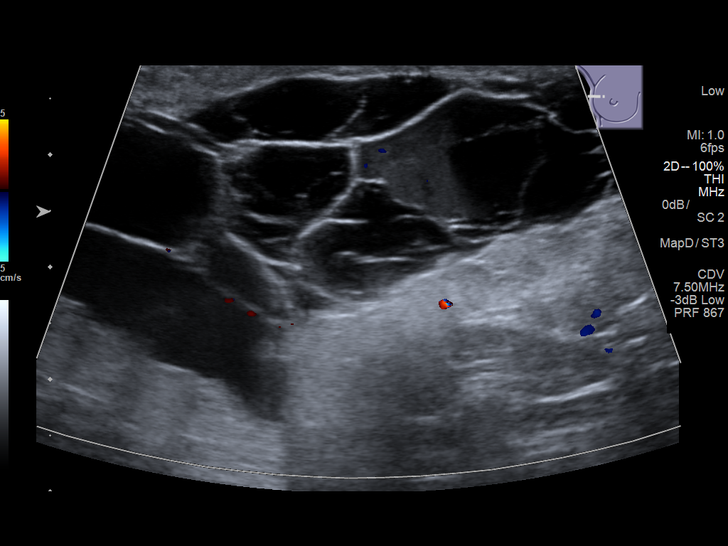
[im 16/21]
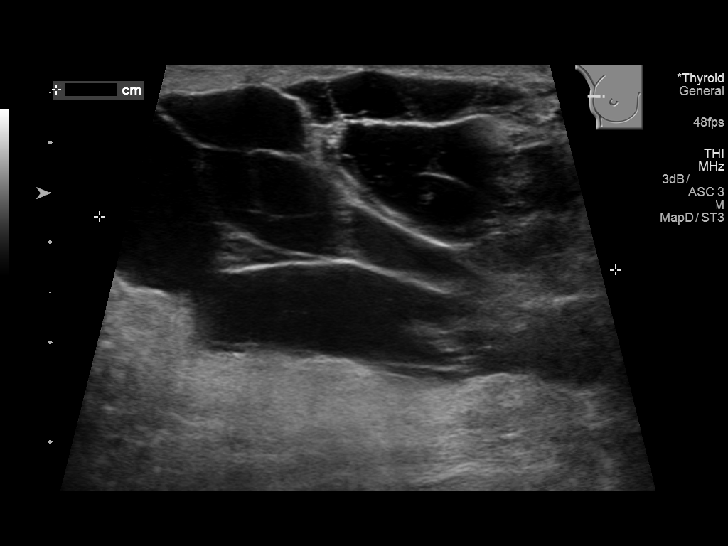
[im 18/21]
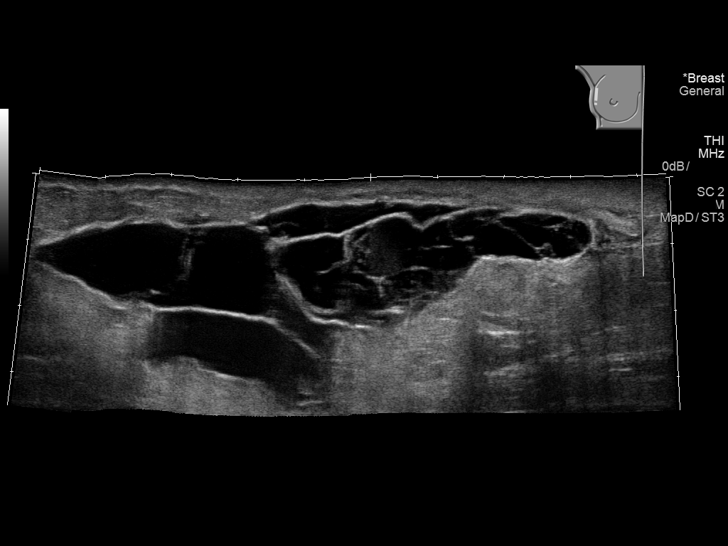
[im 19/21]
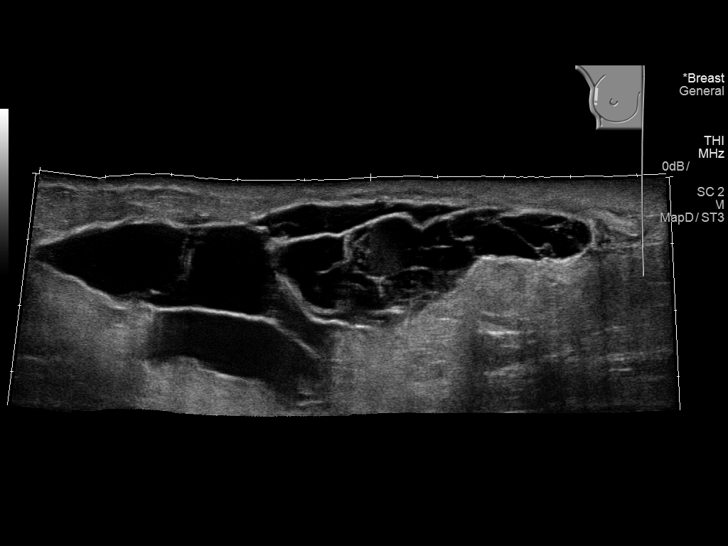
[im 21/21]
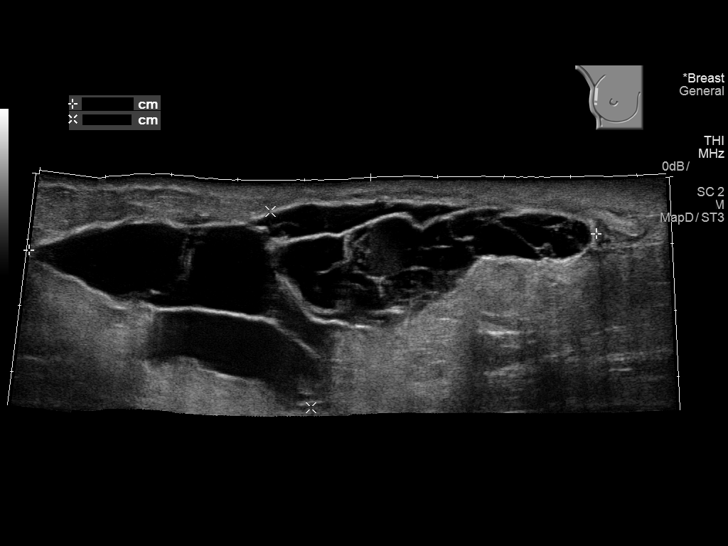

[14 of 21 positions shown; findings below may reference images not displayed]

FINDINGS: On physical exam, note is made of erythema at the upper outer
quadrant of the right breast.

Targeted ultrasound is performed, showing a large complex hypoechoic
cystic region with numerous thickened septations, measuring 8.5 x
3.0 x 5.2 cm, at the 11 o'clock position of the right breast, 8 cm
from the areola. No associated hyperemia is noted. Given clinical
suspicion, this is concerning for abscess.
IMPRESSION: Large complex hypoechoic cystic region with numerous thickened
septations, measuring 8.5 x 3.0 x 5.2 cm, at the 11 o'clock position
of the right breast, 8 cm from the areola. No associated hyperemia
noted. Given clinical suspicion and rapid onset, this is concerning
for abscess.

BI-RADS CATEGORY  2: Benign Finding(s)

## 2016-12-24 MED FILL — ANASTROZOLE 1 MG TABLET: 1 | 180 days supply | Qty: 180 | Fill #0

## 2017-01-08 ENCOUNTER — Other Ambulatory Visit: Payer: Self-pay | Admitting: Obstetrics and Gynecology

## 2017-01-08 DIAGNOSIS — C50411 Malignant neoplasm of upper-outer quadrant of right female breast: Secondary | ICD-10-CM

## 2017-01-08 DIAGNOSIS — Z170 Estrogen receptor positive status [ER+]: Principal | ICD-10-CM

## 2017-01-22 ENCOUNTER — Inpatient Hospital Stay: Admission: RE | Admit: 2017-01-22 | Payer: No Typology Code available for payment source | Source: Ambulatory Visit

## 2017-01-22 ENCOUNTER — Ambulatory Visit (HOSPITAL_COMMUNITY): Payer: Self-pay

## 2017-02-19 ENCOUNTER — Encounter: Payer: Self-pay | Admitting: Oncology

## 2017-04-24 ENCOUNTER — Telehealth: Payer: Self-pay | Admitting: *Deleted

## 2017-04-24 NOTE — Telephone Encounter (Signed)
This RN received VM from pt's son stating he has left 2-3 messages regarding needing to schedule an appointment for his mother to see Dr Jana Hakim.  Return call number given [phone].  This RN returned call and obtained identified VM - message left stating current scheduled appointment for feb 2019- as well as left request to return call if appointment needed prior to above - this RN's name given for office contact.

## 2017-06-22 ENCOUNTER — Encounter: Payer: Self-pay | Admitting: Oncology

## 2017-06-22 NOTE — Progress Notes (Signed)
Patient and family remember came in to apply for financial assistance. Asked what type of assistance was needed and the person with the patient states help with bills. I asked her did she bring bills with her she said no. She had the orange card and it expired and someone told her to come here. I asked if she had re-applied for the orange card and she said yes and they are waiting. Searched for treatment plan in [facility] and there was not a plan in. Asked if patient was going to be doing chemotherapy and she said no she only had radiation and it was completed. She states she has an appointment coming up. Advised her that if after her visit with the doctor, she has to do some additional treatment such as chemotherapy, then she can call me to schedule an appointment to discuss available assistance for her to apply for. Gave her my card for any additional financial questions or concerns.

## 2017-06-24 NOTE — Progress Notes (Signed)
[facility]  Telephone:([phone]) [phone] Fax:(336) [phone]     ID: Casidee Jann DOB: 1956-01-28  MR#: [id number]  YSA#:[id number]  Patient Care Team: Patient, No Pcp Per as PCP - General (General Practice) Brantleigh Mifflin, Virgie Dad, MD as Consulting Physician (Oncology) Rolm Bookbinder, MD as Consulting Physician (General Surgery) Kyung Rudd, MD as Consulting Physician (Radiation Oncology) Delice Bison, Charlestine Massed, NP as Nurse Practitioner (Hematology and Oncology) Shawnie Dapper OTHER MD:  CHIEF COMPLAINT: Invasive ductal carcinoma, unknown prognostic panel  CURRENT TREATMENT: anastrozole   BREAST CANCER HISTORY: On the original intake note:  Mykira tells me she had pain in the right breast, which brought her to a physician in [place], where she normally resides. They obtained a mammogram, but she does not know those results. 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 There is no prognostic panel  More recently, while visiting family in [place], the patient presented to the emergency department with a complaint of right breast pain and redness. She was afebrile. Needle aspiration was obtained and the patient was started on Augmentin. The aspirate cultures remain negative.  On 12/22/2015 she had a right breast ultrasound at the [facility]. This showed, at the 11:00 position of the right breast, 8 cm from the nipple, a large complex cystic region measuring up to 8.5 cm. There was no hyperemia.   The patient was referred to surgery, and on 01/18/2016 she had bilateral diagnostic mammography with tomography and [facility]. The breast density was category B. In the upper outer right breast there was a 5.7 cm mass,  with an associated 1 cm satellite. There was a large firm mass deforming the contour of the breast and targeted ultrasound demonstrated a large complex collection in the upper outer quadrant of the breast measuring up to 4.8 cm. A borderline lymph node with a moderately thickened cortex was visualized in the right axilla.  Biopsy of the right breast mass and right axillary lymph node 01/25/2016 showed ([id number]) no evidence of malignancy. Cultures from the 01/25/2016 biopsy all remained negative.  She was referred to radiation oncology to receive adjuvant radiation. Her subsequent history is as detailed below  INTERVAL HISTORY: Gwynne returns today for follow-up of her estrogen receptor positive breast cancer accompanied by her daughter and Fish farm manager. She continues on anastrozole, with good tolerance. She notes occasional hot flashes. She denies issues with vaginal dryness.    REVIEW OF SYSTEMS: Zairah reports that last week, she returned from visiting her family and children in [place]. She notes that she didn't see a doctor while she was there. She notes that she takes walks for about 4 miles when the weather is nice. She loves to walk. She notes that she is able to do her own house work and cooking. She denies unusual headaches, visual changes, nausea, vomiting, or dizziness. There has been no unusual cough, phlegm production, or pleurisy. This been no change in bowel or bladder habits. She denies unexplained fatigue or unexplained weight loss, bleeding, rash, or fever. A detailed review of systems was otherwise stable.    PAST MEDICAL HISTORY: Past Medical History:  Diagnosis Date  . Breast cancer ([place])    Diagnosed in [place] in July 2017, Stage IA, T1c, Nx, Mx invasive ductal carcinoma  .  Ulcer     PAST SURGICAL HISTORY: Past Surgical History:  Procedure Laterality Date  . right breast lumpectomy      FAMILY HISTORY Family History  Problem Relation Age of Onset  .  Diabetes Sister   The patient's father died from renal failure in his 54s the patient's mother died from a blood clot in her 36s. The patient had 4 brothers, 8 sisters. There is no history of breast or ovarian cancer in the family to the patient's knowledge  GYNECOLOGIC HISTORY:  No LMP recorded. Patient is postmenopausal. Menarche age 3, first live birth age 52, the patient is GX B8. She is now postmenopausal. She never took hormone replacement.  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 While visiting in the [place] she lives with her daughter [name]. 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 Also at home are the patient's son-in-law [name] , who works for [employer], and their 3 children.    ADVANCED DIRECTIVES: The patient's daughter [name] and son-in-law [name] are jointly the patient's healthcare power of attorney    HEALTH MAINTENANCE: Social History   Tobacco Use  . Smoking status: Never Smoker  . Smokeless tobacco: Never Used  Substance Use Topics  . Alcohol use: No  . Drug use: No     Colonoscopy:  PAP: 01/18/2016 / benign   Bone density:   No Known Allergies  Current Outpatient Medications  Medication Sig Dispense Refill  . anastrozole (ARIMIDEX) 1 MG tablet Take 1 tablet (1 mg total) by mouth daily. 90 tablet 4  . cholecalciferol (VITAMIN D) 1000 units tablet Take 1 tablet (1,000 Units total) by mouth daily. 90 tablet 4   No current facility-administered medications for this visit.     OBJECTIVE: Middle-aged middle [place] woman in no acute distress   Vitals:   06/25/17 1411  BP: (!) 156/76  Pulse: 62  Resp: 18  Temp: 98.4 F (36.9 C)  SpO2: 100%     Body mass index is 27.44 kg/m.    ECOG FS:1 - Symptomatic but completely ambulatory  Sclerae unicteric, EOMs intact Oropharynx clear and moist No cervical or supraclavicular adenopathy, mild thyromegaly without prominent mass Lungs no rales or  rhonchi Heart regular rate and rhythm Abd soft, nontender, positive bowel sounds MSK no focal spinal tenderness, no upper extremity lymphedema Neuro: nonfocal, well oriented, appropriate affect Breasts: The right breast is status post lumpectomy followed by radiation.  There is a prominent scar on the lateral side, with significant in duration, which has not changed from last year.  Despite this the breast contour is well preserved.  I do not palpate a mass in the right axilla.  The left breast and left axilla are benign.  LAB RESULTS:  CMP     Component Value Date/Time   NA 142 06/25/2017 1327   NA 141 12/15/2016 1606   K 4.3 06/25/2017 1327   K 4.1 12/15/2016 1606   CL 106 06/25/2017 1327   CO2 29 06/25/2017 1327   CO2 27 12/15/2016 1606   GLUCOSE 81 06/25/2017 1327   GLUCOSE 124 12/15/2016 1606   BUN 11 06/25/2017 1327   BUN 17.1 12/15/2016 1606   CREATININE 0.74 06/25/2017 1327   CREATININE 0.8 12/15/2016 1606   CALCIUM 9.4 06/25/2017 1327   CALCIUM 9.3 12/15/2016 1606   PROT 7.2 06/25/2017 1327   PROT 7.5 12/15/2016 1606   ALBUMIN 3.8 06/25/2017 1327   ALBUMIN  3.7 12/15/2016 1606   AST 16 06/25/2017 1327   AST 21 12/15/2016 1606   ALT 12 06/25/2017 1327   ALT 16 12/15/2016 1606   ALKPHOS 88 06/25/2017 1327   ALKPHOS 103 12/15/2016 1606   BILITOT 0.5 06/25/2017 1327   BILITOT 0.36 12/15/2016 1606   GFRNONAA >60 06/25/2017 1327   GFRAA >60 06/25/2017 1327    INo results found for: SPEP, UPEP  Lab Results  Component Value Date   WBC 5.9 06/25/2017   NEUTROABS 3.0 06/25/2017   HGB 11.5 (L) 06/25/2017   HCT 34.9 06/25/2017   MCV 89.0 06/25/2017   PLT 181 06/25/2017      Chemistry      Component Value Date/Time   NA 142 06/25/2017 1327   NA 141 12/15/2016 1606   K 4.3 06/25/2017 1327   K 4.1 12/15/2016 1606   CL 106 06/25/2017 1327   CO2 29 06/25/2017 1327   CO2 27 12/15/2016 1606   BUN 11 06/25/2017 1327   BUN 17.1 12/15/2016 1606   CREATININE 0.74  06/25/2017 1327   CREATININE 0.8 12/15/2016 1606      Component Value Date/Time   CALCIUM 9.4 06/25/2017 1327   CALCIUM 9.3 12/15/2016 1606   ALKPHOS 88 06/25/2017 1327   ALKPHOS 103 12/15/2016 1606   AST 16 06/25/2017 1327   AST 21 12/15/2016 1606   ALT 12 06/25/2017 1327   ALT 16 12/15/2016 1606   BILITOT 0.5 06/25/2017 1327   BILITOT 0.36 12/15/2016 1606       No results found for: LABCA2  No components found for: LABCA125  No results for input(s): INR in the last 168 hours.  Urinalysis    Component Value Date/Time   COLORURINE YELLOW 11/13/2012 1059   APPEARANCEUR CLEAR 11/13/2012 1059   LABSPEC 1.010 11/13/2012 1059   PHURINE 8.5 (H) 11/13/2012 1059   GLUCOSEU NEGATIVE 11/13/2012 1059   HGBUR NEGATIVE 11/13/2012 1059   BILIRUBINUR NEGATIVE 11/13/2012 1059   KETONESUR NEGATIVE 11/13/2012 1059   PROTEINUR NEGATIVE 11/13/2012 1059   UROBILINOGEN 0.2 11/13/2012 1059   NITRITE NEGATIVE 11/13/2012 1059   LEUKOCYTESUR TRACE (A) 11/13/2012 1059     STUDIES: She is overdue for mammography  ELIGIBLE FOR AVAILABLE RESEARCH PROTOCOL: No  ASSESSMENT: 62 y.o. [place] Woman, originally from [place], status post right breast upper outer quadrant lumpectomy July 2017 for a pT1c pNX, stage IA invasive ductal carcinoma,  grade 3, prognostic panel not available  (1) complex cystic mass in the right breast and suspicious right axillary lymph node both biopsied 01/25/2016, showing no evidence of malignancy. Multiple cultures were negative as well   (2) adjuvant radiation 03/23/2016 to 05/15/2015: Site/dose:   The patient initially received a dose of 50.4 Gy in 28 fractions to the breast using whole-breast tangent fields. This was delivered using a 3-D conformal technique. The patient then received a boost to the seroma. This delivered an additional 14 Gy in 7 fractions using an isodose plan with energy 10X and 6X. The total dose was 64.4 Gy.  (3) started anastrozole  06/05/2016  (a) bone density at the [facility] 12/08/2016 finds a T score of -1.5  PLAN: Tiffine is now a little over a year and a half out from definitive surgery for her breast cancer with no evidence of disease recurrence.  This is very favorable.  She is tolerating anastrozole well, and the plan will be to continue that to a total of 5 years, through February 2023.  She has  mild osteopenia.  She is on vitamin D supplementation.  She enjoys walking and I have strongly encouraged her to continue that.  She is due for repeat mammography and I have added ultrasonography because of concerns regarding prior findings in her right axilla.  I do not think her thyroid gland warrants further evaluation at this point but I will make sure to recheck it when she returns to see me.  That will be a year from now.  Note that she very likely will be returning to [place] in June and perhaps staying there until early next year  They know to call for any other issues that may develop before the next visit.  Kohan Azizi, Virgie Dad, MD  06/25/17 2:33 PM Medical Oncology and Hematology [facility] [address] [place], [place] [address] Tel. [phone]    Fax. [phone]  This document serves as a record of services personally performed by Lurline Del, MD. It was created on his behalf by Sheron Nightingale, a trained medical scribe. The creation of this record is based on the scribe's personal observations and the provider's statements to them.   I have reviewed the above documentation for accuracy and completeness, and I agree with the above.

## 2017-06-25 ENCOUNTER — Telehealth: Payer: Self-pay | Admitting: Oncology

## 2017-06-25 ENCOUNTER — Inpatient Hospital Stay: Payer: Self-pay | Attending: Oncology | Admitting: Oncology

## 2017-06-25 ENCOUNTER — Inpatient Hospital Stay: Payer: Self-pay

## 2017-06-25 VITALS — BP 156/76 | HR 62 | Temp 98.4°F | Resp 18 | Ht 63.0 in | Wt 154.9 lb

## 2017-06-25 DIAGNOSIS — M8580 Other specified disorders of bone density and structure, unspecified site: Secondary | ICD-10-CM | POA: Insufficient documentation

## 2017-06-25 DIAGNOSIS — Z79811 Long term (current) use of aromatase inhibitors: Secondary | ICD-10-CM | POA: Insufficient documentation

## 2017-06-25 DIAGNOSIS — C50411 Malignant neoplasm of upper-outer quadrant of right female breast: Secondary | ICD-10-CM

## 2017-06-25 DIAGNOSIS — Z170 Estrogen receptor positive status [ER+]: Secondary | ICD-10-CM | POA: Insufficient documentation

## 2017-06-25 LAB — CBC WITH DIFFERENTIAL/PLATELET
Basophils Absolute: 0 10*3/uL (ref 0.0–0.1)
Basophils Relative: 0 %
Eosinophils Absolute: 0.1 10*3/uL (ref 0.0–0.5)
Eosinophils Relative: 2 %
HCT: 34.9 % (ref 34.8–46.6)
Hemoglobin: 11.5 g/dL — ABNORMAL LOW (ref 11.6–15.9)
Lymphocytes Relative: 30 %
Lymphs Abs: 1.8 10*3/uL (ref 0.9–3.3)
MCH: 29.3 pg (ref 25.1–34.0)
MCHC: 33 g/dL (ref 31.5–36.0)
MCV: 89 fL (ref 79.5–101.0)
Monocytes Absolute: 1 10*3/uL — ABNORMAL HIGH (ref 0.1–0.9)
Monocytes Relative: 17 %
Neutro Abs: 3 10*3/uL (ref 1.5–6.5)
Neutrophils Relative %: 51 %
Platelets: 181 10*3/uL (ref 145–400)
RBC: 3.92 MIL/uL (ref 3.70–5.45)
RDW: 13.9 % (ref 11.2–14.5)
WBC: 5.9 10*3/uL (ref 3.9–10.3)

## 2017-06-25 LAB — COMPREHENSIVE METABOLIC PANEL
ALT: 12 U/L (ref 0–55)
AST: 16 U/L (ref 5–34)
Albumin: 3.8 g/dL (ref 3.5–5.0)
Alkaline Phosphatase: 88 U/L (ref 40–150)
Anion gap: 7 (ref 3–11)
BUN: 11 mg/dL (ref 7–26)
CO2: 29 mmol/L (ref 22–29)
Calcium: 9.4 mg/dL (ref 8.4–10.4)
Chloride: 106 mmol/L (ref 98–109)
Creatinine, Ser: 0.74 mg/dL (ref 0.60–1.10)
GFR calc Af Amer: >60 mL/min (ref >60)
GFR calc non Af Amer: >60 mL/min (ref >60)
Glucose, Bld: 81 mg/dL (ref 70–140)
Potassium: 4.3 mmol/L (ref 3.5–5.1)
Sodium: 142 mmol/L (ref 136–145)
Total Bilirubin: 0.5 mg/dL (ref 0.2–1.2)
Total Protein: 7.2 g/dL (ref 6.4–8.3)

## 2017-06-25 MED ORDER — ANASTROZOLE 1 MG PO TABS: 1.0000 mg | ORAL_TABLET | Freq: Every day | ORAL | 4 refills | Status: DC

## 2017-06-25 MED ORDER — ANASTROZOLE 1 MG PO TABS: 1.0000 mg | ORAL_TABLET | Freq: Every day | ORAL | 2 refills | Status: DC

## 2017-06-26 ENCOUNTER — Other Ambulatory Visit: Payer: Self-pay

## 2017-06-26 ENCOUNTER — Other Ambulatory Visit: Payer: Self-pay | Admitting: Medical Oncology

## 2017-06-26 DIAGNOSIS — Z170 Estrogen receptor positive status [ER+]: Principal | ICD-10-CM

## 2017-06-26 DIAGNOSIS — C50411 Malignant neoplasm of upper-outer quadrant of right female breast: Secondary | ICD-10-CM

## 2017-06-26 MED ORDER — ANASTROZOLE 1 MG PO TABS
1.0000 mg | ORAL_TABLET | Freq: Every day | ORAL | 0 refills | Status: DC
Start: 2017-06-26 — End: 2017-10-30

## 2017-06-26 MED ORDER — ANASTROZOLE 1 MG PO TABS: 1.0000 mg | ORAL_TABLET | Freq: Every day | ORAL | 0 refills | Status: DC

## 2017-06-26 NOTE — Telephone Encounter (Signed)
Pt's son called [facility] regarding Anastrozole was not available to be picked up from pharmacy.  Noted prescription was set to be called in and not e-scribed, corrected to e-scribed and sent to [place] as son prefers.  He will pick it up on Monday for patient. No further needs at this time.

## 2017-06-29 MED FILL — ANASTROZOLE 1 MG TABLET: 1 | 180 days supply | Qty: 180 | Fill #1

## 2017-07-16 ENCOUNTER — Encounter: Payer: Self-pay | Admitting: Adult Health

## 2017-09-30 ENCOUNTER — Emergency Department (HOSPITAL_COMMUNITY)
Admission: EM | Admit: 2017-09-30 | Discharge: 2017-09-30 | Disposition: A | Discharge status: Home / Self Care | Payer: Self-pay | Attending: Emergency Medicine | Admitting: Emergency Medicine

## 2017-09-30 ENCOUNTER — Encounter (HOSPITAL_COMMUNITY): Payer: Self-pay | Admitting: Emergency Medicine

## 2017-09-30 ENCOUNTER — Other Ambulatory Visit: Payer: Self-pay

## 2017-09-30 DIAGNOSIS — R21 Rash and other nonspecific skin eruption: Secondary | ICD-10-CM | POA: Insufficient documentation

## 2017-09-30 DIAGNOSIS — Z79899 Other long term (current) drug therapy: Secondary | ICD-10-CM | POA: Insufficient documentation

## 2017-09-30 MED ORDER — TRIAMCINOLONE ACETONIDE 0.1 % EX CREA: 1.0000 "application " | TOPICAL_CREAM | Freq: Two times a day (BID) | CUTANEOUS | 0 refills | Status: DC

## 2017-09-30 MED FILL — TRIAMCINOLONE ACETONIDE 0.1: 0.1 | 15 days supply | Qty: 30 | Fill #0

## 2017-09-30 NOTE — ED Provider Notes (Signed)
[facility] EM[facility]ENC[facility] DEPARTMENT Provider Note   CSN: [id number] Arrival date & time: 09/30/17  0807     History   Chief Complaint Chief Complaint  Patient presents with  . Itching    HPI Kelly Yu is a 62 y.o. female with a past medical history of breast cancer, who presents to ED for evaluation of 1 month history of pruritic rash to right foot.  She states that symptoms began with no triggering factor I month ago.  She has tried over-the-counter hydrocortisone cream with no improvement in her symptoms.  She states that it is also developing now on the left foot.  No prior history of similar symptoms.  Denies any rash elsewhere the body.  Denies any numbness, tingling, fever, insect bites, angioedema or anaphylaxis.  HPI  Past Medical History:  Diagnosis Date  . Breast cancer ([facility])    Diagnosed in [place] in July 2017, Stage IA, T1c, Nx, Mx invasive ductal carcinoma  . Ulcer     Patient Active Problem List   Diagnosis Date Noted  . Malignant neoplasm of upper-outer quadrant of right breast in female, estrogen receptor positive ([facility]) 03/13/2016  . Breast mass s/p lumpectomy [id number]  12/22/2015  . Seroma, postoperative of breast 12/22/2015    Past Surgical History:  Procedure Laterality Date  . right breast lumpectomy       OB History    Gravida  8   Para  8   Term  8   Preterm      AB      Living  8     SAB      TAB      Ectopic      Multiple      Live Births  8            Home Medications    Prior to Admission medications   Medication Sig Start Date End Date Taking? Authorizing Provider  anastrozole (ARIMIDEX) 1 MG tablet Take 1 tablet (1 mg total) by mouth daily. 06/26/17   [name], Virgie Dad, MD  cholecalciferol (VITAMIN D) 1000 units tablet Take 1 tablet (1,000 Units total) by mouth daily. 12/17/16   [name], Virgie Dad, MD  triamcinolone cream (KENALOG) 0.1 % Apply 1 application topically 2 (two) times daily.  09/30/17   Delia Heady, PA-C    Family History Family History  Problem Relation Age of Onset  . Diabetes Sister     Social History Social History   Tobacco Use  . Smoking status: Never Smoker  . Smokeless tobacco: Never Used  Substance Use Topics  . Alcohol use: No  . Drug use: No     Allergies   Patient has no known allergies.   Review of Systems Review of Systems  HENT: Negative for facial swelling.   Musculoskeletal: Negative for joint swelling, myalgias and neck pain.  Skin: Positive for rash.  Neurological: Negative for weakness and numbness.     Physical Exam Updated Vital Signs BP 128/72 (BP Location: Right Arm)   Pulse 76   Temp 98.2 F (36.8 C) (Oral)   Resp 16   Ht 5\' 5"  (1.651 m)   Wt 70.3 kg (155 lb)   SpO2 100%   BMI 25.79 kg/m   Physical Exam  Constitutional: She appears well-developed and well-nourished. No distress.  HENT:  Head: Normocephalic and atraumatic.  Eyes: Conjunctivae and EOM are normal. No scleral icterus.  Neck: Normal range of motion.  Pulmonary/Chest: Effort normal. No  respiratory distress.  Neurological: She is alert.  Skin: Rash noted. She is not diaphoretic.  Rough, peeling skin noted to dorsum of the right foot with associated excoriation marks noted.  Dry to touch.  Right lower extremity is neurovascularly intact.  2+ DP pulse noted.  Psychiatric: She has a normal mood and affect.  Nursing note and vitals reviewed.    ED Treatments / Results  Labs (all labs ordered are listed, but only abnormal results are displayed) Labs Reviewed - No data to display  EKG None  Radiology No results found.  Procedures Procedures (including critical care time)  Medications Ordered in ED Medications - No data to display   Initial Impression / Assessment and Plan / ED Course  I have reviewed the triage vital signs and the nursing notes.  Pertinent labs & imaging results that were available during my care of the patient  were reviewed by me and considered in my medical decision making (see chart for details).     Patient presents to ED for evaluation of 1 month history of pruritic rash to right foot.  Symptoms began with no triggering factor.  No improvement with hydrocortisone cream purchased over-the-counter.  On physical exam there is evidence of rough, peeling dry skin noted to dorsum of the right foot and in webspaces of toes.  There are excoriation marks from patient constantly scratching.  There is no overlying erythema or superimposed infection noted.  She is afebrile.  Pt has a patent airway without stridor and is handling secretions without difficulty; no angioedema. No blisters, no pustules, no warmth, no draining sinus tracts, no superficial abscesses, no bullous impetigo, no vesicles, no desquamation, no target lesions with dusky purpura or a central bulla. Not tender to touch. No concern for superimposed infection. No concern for SJS, TEN, TSS, tick borne illness, syphilis or other life-threatening condition.  Unsure of cause of rash although she may be worsening the condition with the constant scratching.  Will give triamcinolone cream and follow-up with dermatology if symptoms do not improve.  Advised to return to ED for any severe worsening symptoms.  Portions of this note were generated with [employer]. Dictation errors may occur despite best attempts at proofreading.   Final Clinical Impressions(s) / ED Diagnoses   Final diagnoses:  Rash and nonspecific skin eruption    ED Discharge Orders        Ordered    triamcinolone cream (KENALOG) 0.1 %  2 times daily     09/30/17 1023       Delia Heady, PA-C 09/30/17 1027    Mesner, Corene Cornea, MD 09/30/17 1101

## 2017-09-30 NOTE — ED Triage Notes (Signed)
Pt reports rash/itching to R foot X1 mo. Pt has been using OTC Hydrocortisone cream with no relief.

## 2017-10-30 ENCOUNTER — Other Ambulatory Visit: Payer: Self-pay

## 2017-10-30 DIAGNOSIS — Z170 Estrogen receptor positive status [ER+]: Principal | ICD-10-CM

## 2017-10-30 DIAGNOSIS — C50411 Malignant neoplasm of upper-outer quadrant of right female breast: Secondary | ICD-10-CM

## 2017-10-30 MED ORDER — ANASTROZOLE 1 MG PO TABS: 1.0000 mg | ORAL_TABLET | Freq: Every day | ORAL | 0 refills | Status: DC

## 2017-12-24 MED FILL — ANASTROZOLE 1 MG TABLET: 1 | 180 days supply | Qty: 180 | Fill #0

## 2018-06-24 ENCOUNTER — Inpatient Hospital Stay: Payer: Self-pay

## 2018-06-24 ENCOUNTER — Inpatient Hospital Stay: Payer: Self-pay | Attending: Oncology | Admitting: Adult Health

## 2018-06-24 ENCOUNTER — Encounter: Payer: Self-pay | Admitting: Adult Health

## 2018-06-24 NOTE — Progress Notes (Deleted)
[facility]  Telephone:(336) [phone] Fax:(336) [phone]     ID: Kelly Yu DOB: 04-17-1956  MR#: [id number]  GYF#:[id number]  Patient Care Team: Patient, No Pcp Per as PCP - General (General Practice) [name], Virgie Dad, MD as Consulting Physician (Oncology) Rolm Bookbinder, MD as Consulting Physician (General Surgery) Kyung Rudd, MD as Consulting Physician (Radiation Oncology) Delice Bison, Charlestine Massed, NP as Nurse Practitioner (Hematology and Oncology) Scot Dock, NP OTHER MD:  CHIEF COMPLAINT: Invasive ductal carcinoma, unknown prognostic panel  CURRENT TREATMENT: anastrozole   BREAST CANCER HISTORY: On the original intake note:  Kelly Yu tells me she had pain in the right breast, which brought her to a physician in [place], where she normally resides. They obtained a mammogram, but she does not know those results. 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 There is no prognostic panel  More recently, while visiting family in [place], the patient presented to the emergency department with a complaint of right breast pain and redness. She was afebrile. Needle aspiration was obtained and the patient was started on Augmentin. The aspirate cultures remain negative.  On 12/22/2015 she had a right breast ultrasound at the [facility]. This showed, at the 11:00 position of the right breast, 8 cm from the nipple, a large complex cystic region measuring up to 8.5 cm. There was no hyperemia.   The patient was referred to surgery, and on 01/18/2016 she had bilateral diagnostic mammography with [facility] The breast density was category B. In the upper outer right breast there was a 5.7 cm mass,  with an associated 1 cm satellite. There was a large firm mass deforming the contour of the breast and targeted ultrasound demonstrated a large complex collection in the upper outer quadrant of the breast measuring up to 4.8 cm. A borderline lymph node with a moderately thickened cortex was visualized in the right axilla.  Biopsy of the right breast mass and right axillary lymph node 01/25/2016 showed ([id number]) no evidence of malignancy. Cultures from the 01/25/2016 biopsy all remained negative.  She was referred to radiation oncology to receive adjuvant radiation. Her subsequent history is as detailed below  INTERVAL HISTORY: Kelly Yu returns today for follow-up of her estrogen receptor positive breast cancer accompanied by her daughter and Fish farm manager. She continues on anastrozole, with good tolerance.    REVIEW OF SYSTEMS: Kelly Yu  PAST MEDICAL HISTORY: Past Medical History:  Diagnosis Date  . Breast cancer ([place])    Diagnosed in [place] in July 2017, Stage IA, T1c, Nx, Mx invasive ductal carcinoma  . Ulcer     PAST SURGICAL HISTORY: Past Surgical History:  Procedure Laterality Date  . right breast lumpectomy      FAMILY HISTORY Family History  Problem Relation Age of Onset  . Diabetes Sister   The patient's father died from renal failure in his 46s the patient's mother died from a blood clot in her 8s. The patient had 4 brothers, 8 sisters. There is no history of breast or ovarian cancer in the family to the patient's knowledge  GYNECOLOGIC HISTORY:  No LMP recorded. Patient is postmenopausal. Menarche age 89, first live birth age 42, the patient is GX B8. She is now postmenopausal. She never took hormone replacement.  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 She is widowed. While visiting in the [place] she lives with her daughter Kelly Yu. 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 Also at home are the patient's son-in-law  Kelly Yu , who works for [employer], and their 3 children.    ADVANCED DIRECTIVES: The patient's daughter Kelly Yu and son-in-law Kelly Yu are jointly the patient's healthcare power of attorney    HEALTH MAINTENANCE: Social History   Tobacco Use  . Smoking status: Never Smoker  . Smokeless tobacco: Never Used  Substance Use Topics  . Alcohol use: No  . Drug use: No     Colonoscopy:  PAP: 01/18/2016 / benign   Bone density:   No Known Allergies  Current Outpatient Medications  Medication Sig Dispense Refill  . anastrozole (ARIMIDEX) 1 MG tablet Take 1 tablet (1 mg total) by mouth daily. 180 tablet 0  . cholecalciferol (VITAMIN D) 1000 units tablet Take 1 tablet (1,000 Units total) by mouth daily. 90 tablet 4  . triamcinolone cream (KENALOG) 0.1 % Apply 1 application topically 2 (two) times daily. 30 g 0   No current facility-administered medications for this visit.     OBJECTIVE: There were no vitals filed for this visit.   There is no height or weight on file to calculate BMI.    ECOG FS:1 - Symptomatic but completely ambulatory GENERAL: Patient is a well appearing female in no acute distress HEENT:  Sclerae anicteric.  Oropharynx clear and moist. No ulcerations or evidence of oropharyngeal candidiasis. Neck is supple.  NODES:  No cervical, supraclavicular, or axillary lymphadenopathy palpated.  BREAST EXAM:  Deferred. LUNGS:  Clear to auscultation bilaterally.  No wheezes or rhonchi. HEART:  Regular rate and rhythm. No murmur appreciated. ABDOMEN:  Soft, nontender.  Positive, normoactive bowel sounds. No organomegaly palpated. MSK:  No focal spinal tenderness to palpation. Full range of motion bilaterally in the upper extremities. EXTREMITIES:  No peripheral edema.   SKIN:  Clear with no obvious rashes or skin changes. No nail dyscrasia. NEURO:  Nonfocal. Well oriented.  Appropriate affect.    LAB RESULTS:  CMP     Component Value Date/Time   NA 142 06/25/2017 1327   NA  141 12/15/2016 1606   K 4.3 06/25/2017 1327   K 4.1 12/15/2016 1606   CL 106 06/25/2017 1327   CO2 29 06/25/2017 1327   CO2 27 12/15/2016 1606   GLUCOSE 81 06/25/2017 1327   GLUCOSE 124 12/15/2016 1606   BUN 11 06/25/2017 1327   BUN 17.1 12/15/2016 1606   CREATININE 0.74 06/25/2017 1327   CREATININE 0.8 12/15/2016 1606   CALCIUM 9.4 06/25/2017 1327   CALCIUM 9.3 12/15/2016 1606   PROT 7.2 06/25/2017 1327   PROT 7.5 12/15/2016 1606   ALBUMIN 3.8 06/25/2017 1327   ALBUMIN 3.7 12/15/2016 1606   AST 16 06/25/2017 1327   AST 21 12/15/2016 1606   ALT 12 06/25/2017 1327   ALT 16 12/15/2016 1606   ALKPHOS 88 06/25/2017 1327   ALKPHOS 103 12/15/2016 1606   BILITOT 0.5 06/25/2017 1327   BILITOT 0.36 12/15/2016 1606   GFRNONAA >60 06/25/2017 1327   GFRAA >60 06/25/2017 1327    INo results found for: SPEP, UPEP  Lab Results  Component Value Date   WBC 5.9 06/25/2017   NEUTROABS 3.0 06/25/2017   HGB 11.5 (L) 06/25/2017   HCT 34.9 06/25/2017   MCV 89.0 06/25/2017   PLT 181 06/25/2017  Chemistry      Component Value Date/Time   NA 142 06/25/2017 1327   NA 141 12/15/2016 1606   K 4.3 06/25/2017 1327   K 4.1 12/15/2016 1606   CL 106 06/25/2017 1327   CO2 29 06/25/2017 1327   CO2 27 12/15/2016 1606   BUN 11 06/25/2017 1327   BUN 17.1 12/15/2016 1606   CREATININE 0.74 06/25/2017 1327   CREATININE 0.8 12/15/2016 1606      Component Value Date/Time   CALCIUM 9.4 06/25/2017 1327   CALCIUM 9.3 12/15/2016 1606   ALKPHOS 88 06/25/2017 1327   ALKPHOS 103 12/15/2016 1606   AST 16 06/25/2017 1327   AST 21 12/15/2016 1606   ALT 12 06/25/2017 1327   ALT 16 12/15/2016 1606   BILITOT 0.5 06/25/2017 1327   BILITOT 0.36 12/15/2016 1606       No results found for: LABCA2  No components found for: LABCA125  No results for input(s): INR in the last 168 hours.  Urinalysis    Component Value Date/Time   COLORURINE YELLOW 11/13/2012 1059   APPEARANCEUR CLEAR 11/13/2012  1059   LABSPEC 1.010 11/13/2012 1059   PHURINE 8.5 (H) 11/13/2012 1059   GLUCOSEU NEGATIVE 11/13/2012 1059   HGBUR NEGATIVE 11/13/2012 1059   BILIRUBINUR NEGATIVE 11/13/2012 1059   KETONESUR NEGATIVE 11/13/2012 1059   PROTEINUR NEGATIVE 11/13/2012 1059   UROBILINOGEN 0.2 11/13/2012 1059   NITRITE NEGATIVE 11/13/2012 1059   LEUKOCYTESUR TRACE (A) 11/13/2012 1059     STUDIES: She is overdue for mammography  ELIGIBLE FOR AVAILABLE RESEARCH PROTOCOL: No  ASSESSMENT: 63 y.o. [place] Woman, originally from [place], status post right breast upper outer quadrant lumpectomy July 2017 for a pT1c pNX, stage IA invasive ductal carcinoma,  grade 3, prognostic panel not available  (1) complex cystic mass in the right breast and suspicious right axillary lymph node both biopsied 01/25/2016, showing no evidence of malignancy. Multiple cultures were negative as well   (2) adjuvant radiation 03/23/2016 to 05/15/2015: Site/dose:   The patient initially received a dose of 50.4 Gy in 28 fractions to the breast using whole-breast tangent fields. This was delivered using a 3-D conformal technique. The patient then received a boost to the seroma. This delivered an additional 14 Gy in 7 fractions using an isodose plan with energy 10X and 6X. The total dose was 64.4 Gy.  (3) started anastrozole 06/05/2016  (a) bone density at the [facility] 12/08/2016 finds a T score of -1.5  PLAN: Kelly Yu is now a little over a year and a half out from definitive surgery for her breast cancer with no evidence of disease recurrence.  This is very favorable. They know to call for any other issues that may develop before the next visit.  Wilber Bihari, NP  06/24/18 8:37 AM Medical Oncology and Hematology [facility] [address] [place], [place] [address] Tel. [phone]    Fax. [phone]

## 2018-06-28 ENCOUNTER — Telehealth: Payer: Self-pay | Admitting: Adult Health

## 2018-06-28 ENCOUNTER — Other Ambulatory Visit: Payer: Self-pay | Admitting: Adult Health

## 2018-06-28 DIAGNOSIS — Z170 Estrogen receptor positive status [ER+]: Principal | ICD-10-CM

## 2018-06-28 DIAGNOSIS — C50411 Malignant neoplasm of upper-outer quadrant of right female breast: Secondary | ICD-10-CM

## 2018-06-28 MED ORDER — ANASTROZOLE 1 MG PO TABS: 1.0000 mg | ORAL_TABLET | Freq: Every day | ORAL | 0 refills | Status: DC

## 2018-06-28 MED FILL — ANASTROZOLE 1 MG TABLET: 1 | 30 days supply | Qty: 30 | Fill #0

## 2018-06-28 NOTE — Telephone Encounter (Signed)
Gave calendar  °

## 2018-07-06 ENCOUNTER — Encounter: Payer: Self-pay | Admitting: Adult Health

## 2018-07-06 ENCOUNTER — Inpatient Hospital Stay (HOSPITAL_BASED_OUTPATIENT_CLINIC_OR_DEPARTMENT_OTHER): Payer: Self-pay | Admitting: Adult Health

## 2018-07-06 ENCOUNTER — Inpatient Hospital Stay: Payer: Self-pay | Attending: Oncology

## 2018-07-06 ENCOUNTER — Telehealth: Payer: Self-pay

## 2018-07-06 ENCOUNTER — Telehealth: Payer: Self-pay | Admitting: Oncology

## 2018-07-06 VITALS — BP 140/65 | HR 61 | Temp 99.1°F | Resp 18 | Ht 64.0 in | Wt 152.0 lb

## 2018-07-06 DIAGNOSIS — I700 Atherosclerosis of aorta: Secondary | ICD-10-CM | POA: Insufficient documentation

## 2018-07-06 DIAGNOSIS — Z170 Estrogen receptor positive status [ER+]: Secondary | ICD-10-CM

## 2018-07-06 DIAGNOSIS — R87820 Cervical low risk human papillomavirus (HPV) DNA test positive: Secondary | ICD-10-CM | POA: Insufficient documentation

## 2018-07-06 DIAGNOSIS — C50411 Malignant neoplasm of upper-outer quadrant of right female breast: Secondary | ICD-10-CM | POA: Insufficient documentation

## 2018-07-06 DIAGNOSIS — Z79811 Long term (current) use of aromatase inhibitors: Secondary | ICD-10-CM

## 2018-07-06 LAB — CBC WITH DIFFERENTIAL/PLATELET
Abs Immature Granulocytes: 0.02 10*3/uL (ref 0.00–0.07)
BASOS PCT: 1 %
Basophils Absolute: 0 10*3/uL (ref 0.0–0.1)
EOS PCT: 4 %
Eosinophils Absolute: 0.3 10*3/uL (ref 0.0–0.5)
HCT: 36.2 % (ref 36.0–46.0)
HEMOGLOBIN: 11.7 g/dL — AB (ref 12.0–15.0)
Immature Granulocytes: 0 %
Lymphocytes Relative: 29 %
Lymphs Abs: 1.8 10*3/uL (ref 0.7–4.0)
MCH: 29.3 pg (ref 26.0–34.0)
MCHC: 32.3 g/dL (ref 30.0–36.0)
MCV: 90.5 fL (ref 80.0–100.0)
MONO ABS: 0.6 10*3/uL (ref 0.1–1.0)
MONOS PCT: 9 %
Neutro Abs: 3.6 10*3/uL (ref 1.7–7.7)
Neutrophils Relative %: 57 %
PLATELETS: 175 10*3/uL (ref 150–400)
RBC: 4 MIL/uL (ref 3.87–5.11)
RDW: 13.6 % (ref 11.5–15.5)
WBC: 6.4 10*3/uL (ref 4.0–10.5)
nRBC: 0 % (ref 0.0–0.2)

## 2018-07-06 LAB — COMPREHENSIVE METABOLIC PANEL
ALBUMIN: 3.8 g/dL (ref 3.5–5.0)
ALK PHOS: 91 U/L (ref 38–126)
ALT: 10 U/L (ref 0–44)
AST: 18 U/L (ref 15–41)
Anion gap: 9 (ref 5–15)
BILIRUBIN TOTAL: 0.4 mg/dL (ref 0.3–1.2)
BUN: 16 mg/dL (ref 8–23)
CO2: 25 mmol/L (ref 22–32)
CREATININE: 0.74 mg/dL (ref 0.44–1.00)
Calcium: 9.2 mg/dL (ref 8.9–10.3)
Chloride: 107 mmol/L (ref 98–111)
GFR calc Af Amer: >60 mL/min (ref >60)
GLUCOSE: 98 mg/dL (ref 70–99)
Potassium: 4.7 mmol/L (ref 3.5–5.1)
Sodium: 141 mmol/L (ref 135–145)
TOTAL PROTEIN: 7.3 g/dL (ref 6.5–8.1)

## 2018-07-06 MED ORDER — ANASTROZOLE 1 MG PO TABS: 1.0000 mg | ORAL_TABLET | Freq: Every day | ORAL | 1 refills | Status: DC

## 2018-07-06 MED FILL — ANASTROZOLE 1 MG TABLET: 1 | 90 days supply | Qty: 90 | Fill #0

## 2018-07-06 NOTE — Telephone Encounter (Signed)
Nurse spoke with Curly Shores at [facility] to insure orders were received for patient to have [place] and Diagnostic [facility].  Order for [place] had a written note from provider to include the right axilla and nurse explained that this needs to be done and if a note could be put in patient's chart.  Curly Shores said that she would make sure to put the note in for the technicians.  Orders were faxed previously before calling [facility].

## 2018-07-06 NOTE — Telephone Encounter (Signed)
Gave avs and calendar ° °

## 2018-07-06 NOTE — Patient Instructions (Signed)

## 2018-07-06 NOTE — Progress Notes (Signed)
[facility]  Telephone:(336) [phone] Fax:(336) [phone]     ID: Preciosa Bundrick DOB: [birth year]  MR#: [id number]  JYN#:[id number]  Patient Care Team: Patient, No Pcp Per as PCP - General (General Practice) [name], Virgie Dad, MD as Consulting Physician (Oncology) Rolm Bookbinder, MD as Consulting Physician (General Surgery) Kyung Rudd, MD as Consulting Physician (Radiation Oncology) Delice Bison, Charlestine Massed, NP as Nurse Practitioner (Hematology and Oncology) Scot Dock, NP OTHER MD:  CHIEF COMPLAINT: Invasive ductal carcinoma, unknown prognostic panel  CURRENT TREATMENT: anastrozole   BREAST CANCER HISTORY: On the original intake note:  Kayle tells me she had pain in the right breast, which brought her to a physician in [place], where she normally resides. They obtained a mammogram, but she does not know those results. 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 There is no prognostic panel  More recently, while visiting family in [place], the patient presented to the emergency department with a complaint of right breast pain and redness. She was afebrile. Needle aspiration was obtained and the patient was started on Augmentin. The aspirate cultures remain negative.  On 12/22/2015 she had a right breast ultrasound at the [facility]. This showed, at the 11:00 position of the right breast, 8 cm from the nipple, a large complex cystic region measuring up to 8.5 cm. There was no hyperemia.   The patient was referred to surgery, and on 01/18/2016 she had bilateral diagnostic mammography with tomography and [facility] The breast density was category B. In the upper outer right breast there was a 5.7 cm mass,  with an associated 1 cm satellite. There was a large firm mass deforming the contour of the breast and targeted ultrasound demonstrated a large complex collection in the upper outer quadrant of the breast measuring up to 4.8 cm. A borderline lymph node with a moderately thickened cortex was visualized in the right axilla.  Biopsy of the right breast mass and right axillary lymph node 01/25/2016 showed ([id number]) no evidence of malignancy. Cultures from the 01/25/2016 biopsy all remained negative.  She was referred to radiation oncology to receive adjuvant radiation. Her subsequent history is as detailed below  INTERVAL HISTORY: Charliene returns today for follow-up of her estrogen receptor positive breast cancer accompanied by her daughter and Fish farm manager. She just returned from [place] 2 weeks ago and plans to return in approximately 5.5 months.  She continues on anastrozole, with good tolerance.  She is taking the medication as prescribed.  She occasionally has some tolerable hot flashes but otherwise denies arthralgias and vaginal dryness.    She has not yet had her annual mammogram and requests that I place [facility] at [facility].  She did undergo annual 3d diagnostic bilateral breast mammogram on 07/16/2017 at [facility].  Dystrophic calcifications were seen in the right breast and were associated with her post surgical scar.  An ultrasound was also done that showed no evidence of malignancy.  Per Dr. Virgie Dad note, he had wanted an ultrasound of the axilla as well to f/u of some borderline lymphadenopathy seen on CT chest from 08/2016.  I do not have records of this being completed.  REVIEW OF SYSTEMS: Tuwana is doing moderately well today.  She has been enjoying her stay since her  return from [place].  She denies any issues, but does note some mild discomfort with swallowing.  She has h/o ulcer, and notes that she normally sees [facility] when she is here.  She is feeling  well otherwise.   Andi denies any new pain, unusual headaches, or vision changes.  She is without any unintentional weight loss, nausea, vomiting, bowel/bladder changes.  She hasn't had cough, shortness of breath, chest pain or palpitations.  A detailed ROS was otherwise non contributory.     PAST MEDICAL HISTORY: Past Medical History:  Diagnosis Date  . Breast cancer ([place])    Diagnosed in [place] in July 2017, Stage IA, T1c, Nx, Mx invasive ductal carcinoma  . Ulcer     PAST SURGICAL HISTORY: Past Surgical History:  Procedure Laterality Date  . right breast lumpectomy      FAMILY HISTORY Family History  Problem Relation Age of Onset  . Diabetes Sister   The patient's father died from renal failure in his 65s the patient's mother died from a blood clot in her 4s. The patient had 4 brothers, 8 sisters. There is no history of breast or ovarian cancer in the family to the patient's knowledge  GYNECOLOGIC HISTORY:  No LMP recorded. Patient is postmenopausal. Menarche age 44, first live birth age 63, the patient is GX B8. She is now postmenopausal. She never took hormone replacement.  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 While visiting in the [place] she lives with her daughter [name]. 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 Also at home are the patient's son-in-law [name] , who works for [employer], and their 3 children.    ADVANCED DIRECTIVES: The patient's daughter [name] and son-in-law [name] are jointly the patient's healthcare power of attorney    HEALTH MAINTENANCE: Social History   Tobacco Use  . Smoking status: Never Smoker  . Smokeless tobacco: Never Used  Substance Use Topics  . Alcohol use: No  . Drug use: No     Colonoscopy:  PAP: 01/18/2016 / benign, + HPV  Bone density:   No Known Allergies  Current Outpatient Medications  Medication Sig Dispense Refill  . anastrozole (ARIMIDEX) 1 MG  tablet Take 1 tablet (1 mg total) by mouth daily. 30 tablet 0  . cholecalciferol (VITAMIN D) 1000 units tablet Take 1 tablet (1,000 Units total) by mouth daily. 90 tablet 4  . triamcinolone cream (KENALOG) 0.1 % Apply 1 application topically 2 (two) times daily. 30 g 0   No current facility-administered medications for this visit.     OBJECTIVE:   Vitals:   07/06/18 0823  BP: 140/65  Pulse: 61  Resp: 18  Temp: 99.1 F (37.3 C)  SpO2: 99%     Body mass index is 26.09 kg/m.    ECOG FS:1 - Symptomatic but completely ambulatory  GENERAL: Patient is a well appearing female in no acute distress HEENT:  Sclerae anicteric.  Oropharynx clear and moist. No ulcerations or evidence of oropharyngeal candidiasis. Neck is supple.  NODES:  No cervical, supraclavicular, or axillary lymphadenopathy palpated. Though axilla exam limited due to patient. BREAST EXAM:  Right breast s/p lumpectomy and radiation, firm area about 1cm underneath lumpectomy site at scar line, left breast benign. LUNGS:  Clear to auscultation bilaterally.  No wheezes or rhonchi. HEART:  Regular rate and rhythm. No murmur appreciated. ABDOMEN:  Soft, nontender.  Positive, normoactive bowel sounds. No organomegaly palpated.  MSK:  No focal spinal tenderness to palpation. Full range of motion bilaterally in the upper extremities. EXTREMITIES:  No peripheral edema.   SKIN:  Clear with no obvious rashes or skin changes. No nail dyscrasia. NEURO:  Nonfocal. Well oriented.  Appropriate affect.    LAB RESULTS:  CMP     Component Value Date/Time   NA 142 06/25/2017 1327   NA 141 12/15/2016 1606   K 4.3 06/25/2017 1327   K 4.1 12/15/2016 1606   CL 106 06/25/2017 1327   CO2 29 06/25/2017 1327   CO2 27 12/15/2016 1606   GLUCOSE 81 06/25/2017 1327   GLUCOSE 124 12/15/2016 1606   BUN 11 06/25/2017 1327   BUN 17.1 12/15/2016 1606   CREATININE 0.74 06/25/2017 1327   CREATININE 0.8 12/15/2016 1606   CALCIUM 9.4 06/25/2017 1327     CALCIUM 9.3 12/15/2016 1606   PROT 7.2 06/25/2017 1327   PROT 7.5 12/15/2016 1606   ALBUMIN 3.8 06/25/2017 1327   ALBUMIN 3.7 12/15/2016 1606   AST 16 06/25/2017 1327   AST 21 12/15/2016 1606   ALT 12 06/25/2017 1327   ALT 16 12/15/2016 1606   ALKPHOS 88 06/25/2017 1327   ALKPHOS 103 12/15/2016 1606   BILITOT 0.5 06/25/2017 1327   BILITOT 0.36 12/15/2016 1606   GFRNONAA >60 06/25/2017 1327   GFRAA >60 06/25/2017 1327    INo results found for: SPEP, UPEP  Lab Results  Component Value Date   WBC 6.4 07/06/2018   NEUTROABS 3.6 07/06/2018   HGB 11.7 (L) 07/06/2018   HCT 36.2 07/06/2018   MCV 90.5 07/06/2018   PLT 175 07/06/2018      Chemistry      Component Value Date/Time   NA 142 06/25/2017 1327   NA 141 12/15/2016 1606   K 4.3 06/25/2017 1327   K 4.1 12/15/2016 1606   CL 106 06/25/2017 1327   CO2 29 06/25/2017 1327   CO2 27 12/15/2016 1606   BUN 11 06/25/2017 1327   BUN 17.1 12/15/2016 1606   CREATININE 0.74 06/25/2017 1327   CREATININE 0.8 12/15/2016 1606      Component Value Date/Time   CALCIUM 9.4 06/25/2017 1327   CALCIUM 9.3 12/15/2016 1606   ALKPHOS 88 06/25/2017 1327   ALKPHOS 103 12/15/2016 1606   AST 16 06/25/2017 1327   AST 21 12/15/2016 1606   ALT 12 06/25/2017 1327   ALT 16 12/15/2016 1606   BILITOT 0.5 06/25/2017 1327   BILITOT 0.36 12/15/2016 1606       No results found for: LABCA2  No components found for: LABCA125  No results for input(s): INR in the last 168 hours.  Urinalysis    Component Value Date/Time   COLORURINE YELLOW 11/13/2012 1059   APPEARANCEUR CLEAR 11/13/2012 1059   LABSPEC 1.010 11/13/2012 1059   PHURINE 8.5 (H) 11/13/2012 1059   GLUCOSEU NEGATIVE 11/13/2012 1059   HGBUR NEGATIVE 11/13/2012 1059   BILIRUBINUR NEGATIVE 11/13/2012 1059   KETONESUR NEGATIVE 11/13/2012 1059   PROTEINUR NEGATIVE 11/13/2012 1059   UROBILINOGEN 0.2 11/13/2012 1059   NITRITE NEGATIVE 11/13/2012 1059   LEUKOCYTESUR TRACE (A)  11/13/2012 1059     STUDIES: She is overdue for mammography  ELIGIBLE FOR AVAILABLE RESEARCH PROTOCOL: No  ASSESSMENT: 63 y.o. [place] Woman, originally from [place], status post right breast upper outer quadrant lumpectomy July 2017 for a pT1c pNX, stage IA invasive ductal carcinoma,  grade 3, prognostic panel not available  (1) complex cystic mass in the  right breast and suspicious right axillary lymph node both biopsied 01/25/2016, showing no evidence of malignancy. Multiple cultures were negative as well   (2) adjuvant radiation 03/23/2016 to 05/15/2015: Site/dose:   The patient initially received a dose of 50.4 Gy in 28 fractions to the breast using whole-breast tangent fields. This was delivered using a 3-D conformal technique. The patient then received a boost to the seroma. This delivered an additional 14 Gy in 7 fractions using an isodose plan with energy 10X and 6X. The total dose was 64.4 Gy.  (3) started anastrozole 06/05/2016  (a) bone density at the [facility] 12/08/2016 finds a T score of -1.5  PLAN: Dovey is doing well today.  She has no clinical or radiographic sign of recurrence.  She will continue taking Anastrozole daily and is tolerating it well. I sent in a refill, for 6 months time at the request of her daughter, Earlene Plater.  We will see if [facility] will refill it for a 6 month time period.  She will need a bone density test again in 12/2018, since she will be in [place] during this time, she can have the bone density test repeated when she returns, which will be likely in 07/2019.      Teresina will undergo mammogram and ultrasound later this month.  I sent the orders [facility] to [facility].  I added on the orders to ensure that her right axilla is checked with the ultrasound to f/u on the conspicuous indeterminate right axillary adenopathy seen on chest CT from 08/2016.  My nurse Cecille Rubin called to confirm and ensure they received the orders and know to include  the right axilla.    Karita and I reviewed her health maintenance.  She was unsure when her last pap was.  I looked in our system and noted it to have been in 2017.  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  I recommended that she have a repeat pap smear due to her hpv positivity.  I also recommended she see community health [facility] wellness about her difficulty swallowing since it sounds like she has a GI history of GERD.  Her daughter Earlene Plater assures me that she will make this appointment for her.    We will see Karita back in 1 year for labs and f/u with Dr. Jana Hakim.  She knows to call for any questions or concerns prior to her next appointment with [place].   A total of (30) minutes of face-to-face time was spent with this patient with greater than 50% of that time in counseling and care-coordination.   Wilber Bihari, NP  07/06/18 8:47 AM Medical Oncology and Hematology [facility] [address] [place], [place] [address] Tel. [phone]    Fax. [phone]  .

## 2018-07-19 ENCOUNTER — Encounter: Payer: Self-pay | Admitting: Oncology

## 2018-10-13 MED FILL — ANASTROZOLE 1 MG TABLET: 1 | 90 days supply | Qty: 90 | Fill #1

## 2018-12-11 IMAGING — CT CT CHEST W/ CM
2 of 4 series · 15 of 36 positions shown, 18 images · IV contrast (ISOVUE 300)
Comparison: No priors.

CLINICAL DATA: 60-year-old female with history of right-sided
breast cancer diagnosed 2 months ago in [REDACTED]. Status post
lumpectomy and radiation therapy.

EXAM:
CT CHEST WITH CONTRAST
TECHNIQUE: Multidetector CT imaging of the chest was performed during
intravenous contrast administration.
CONTRAST:  75mL 69XTGL-LII IOPAMIDOL (69XTGL-LII) INJECTION 61%

[Series 2: axial st · axial · 0.73mm/px · z∈[+1373,+1633]mm · 12 of 150 slices shown, 15 images]
[im 10/150  mediastinal]
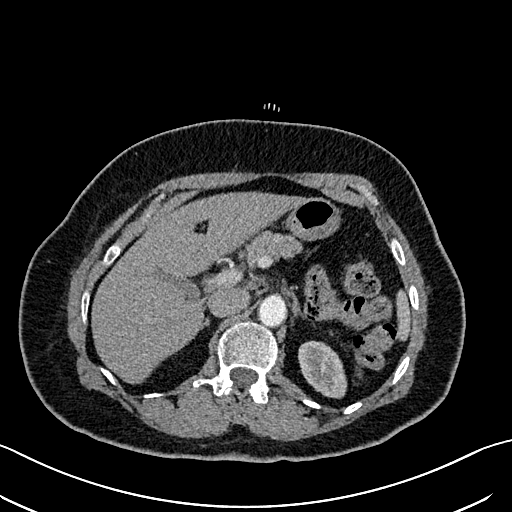
[im 10/150  lung]
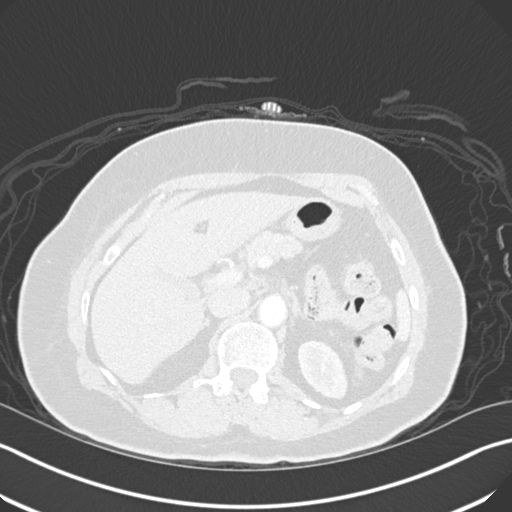
[im 20/150  lung]
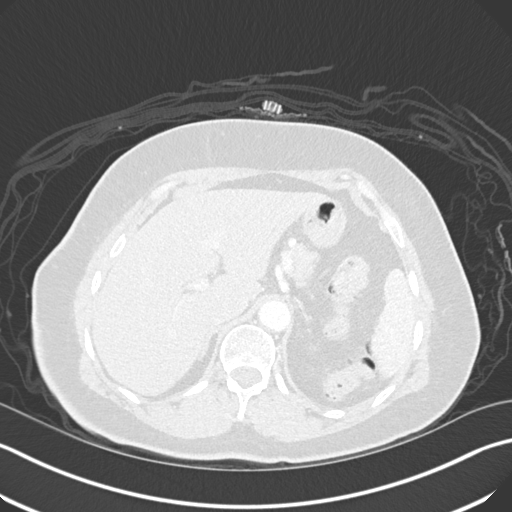
[im 30/150  lung]
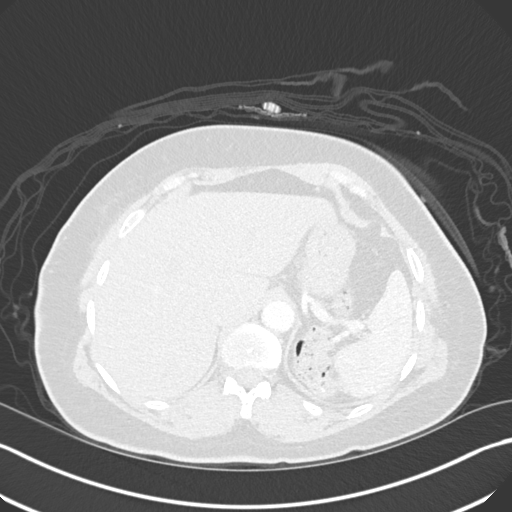
[im 50/150  lung]
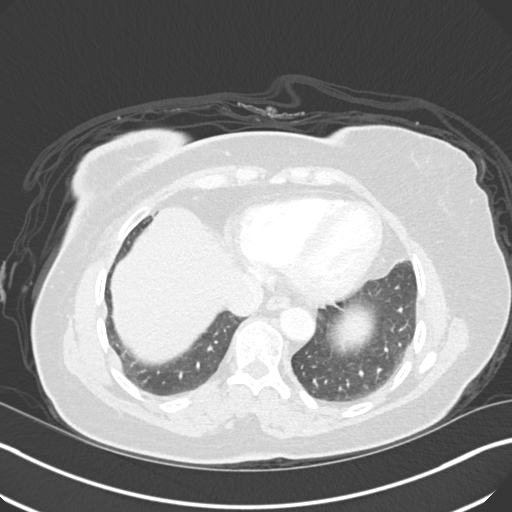
[im 60/150  mediastinal]
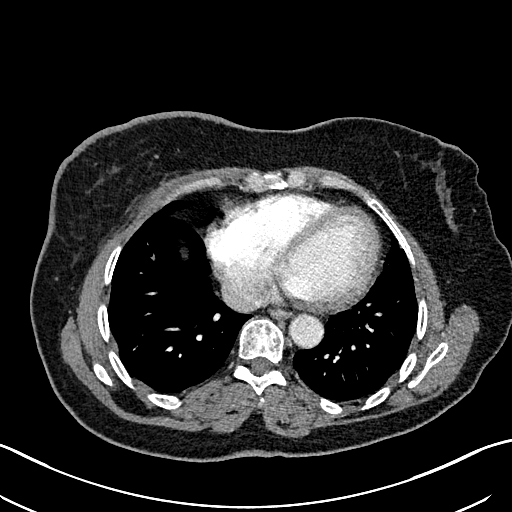
[im 60/150  lung]
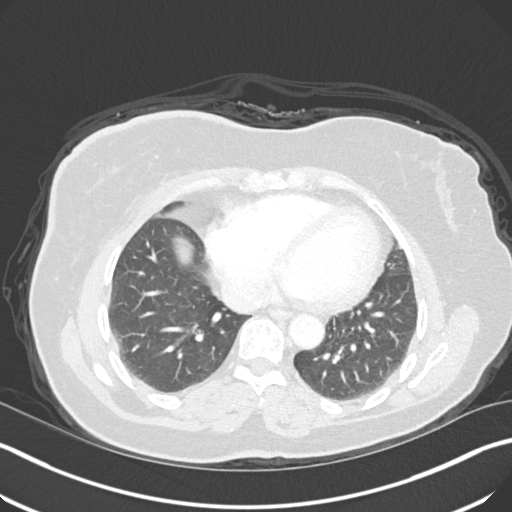
[im 70/150  lung]
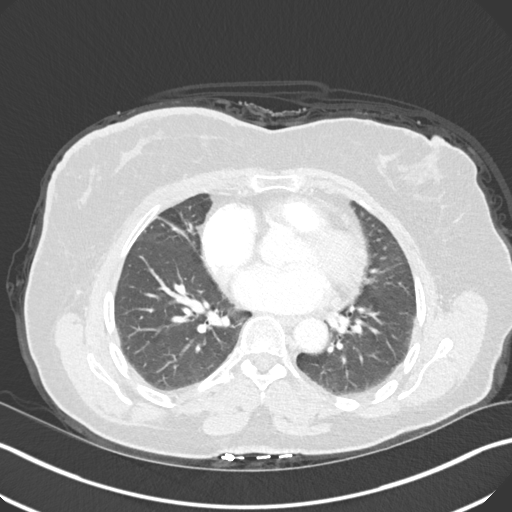
[im 80/150  lung]
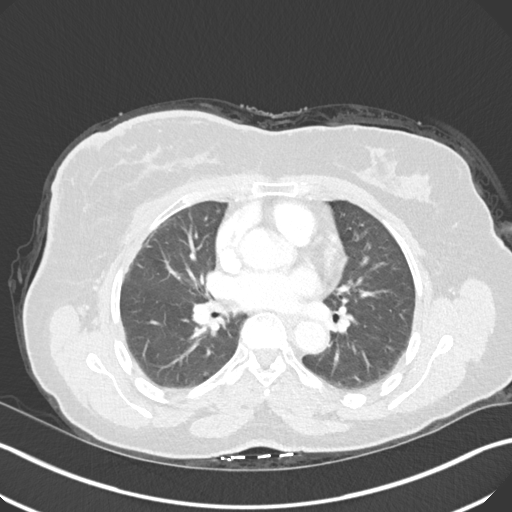
[im 90/150  lung]
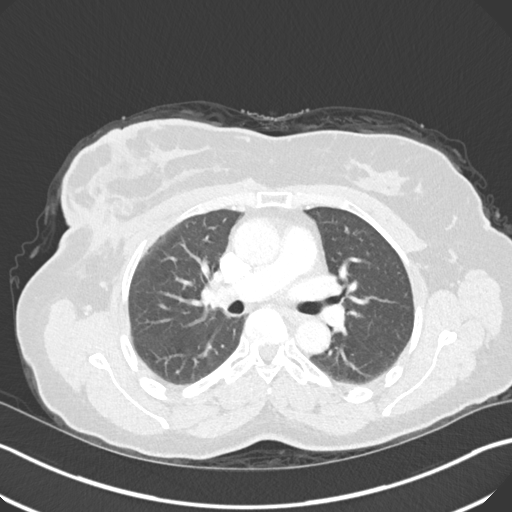
[im 100/150  mediastinal]
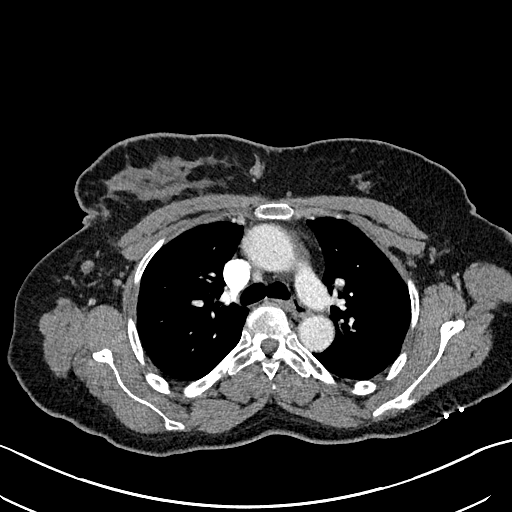
[im 100/150  lung]
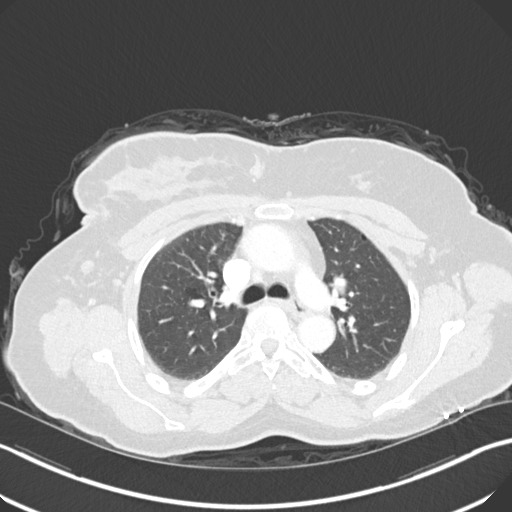
[im 120/150  lung]
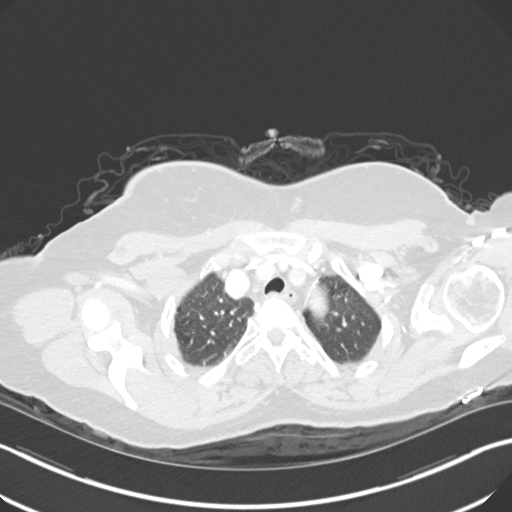
[im 130/150  lung]
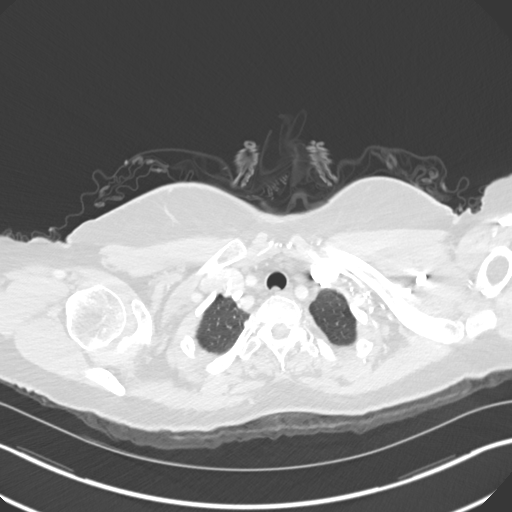
[im 140/150  lung]
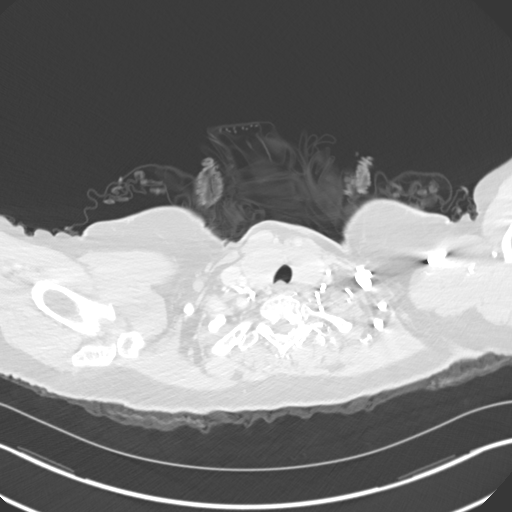

[Series 5: coronal · coronal · 0.65mm/px · 3 of 111 slices shown]
[im 23/111  lung]
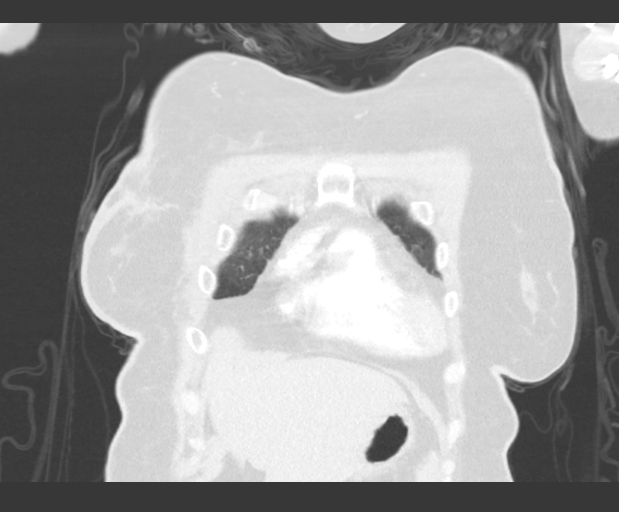
[im 45/111  lung]
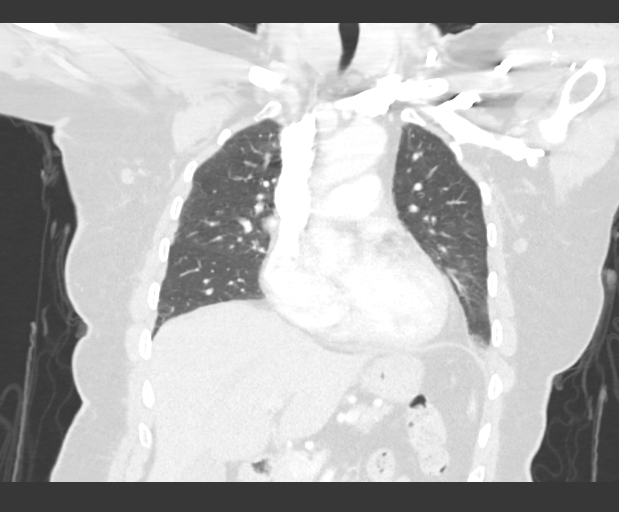
[im 67/111  lung]
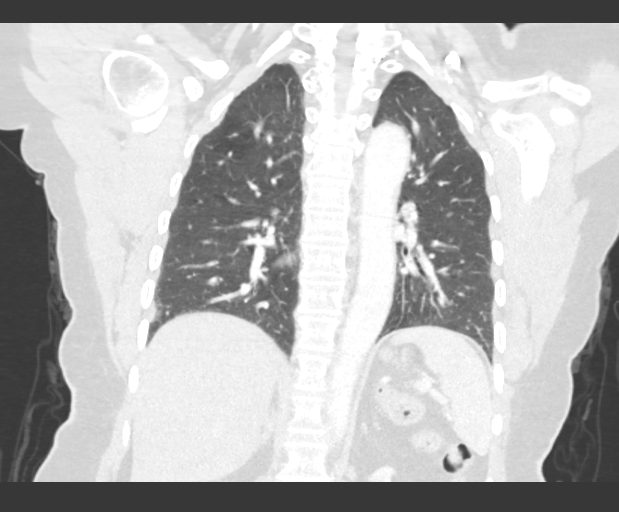

[15 of 36 positions shown; findings below may reference images not displayed]

FINDINGS: Cardiovascular: Heart size is normal. There is no significant
pericardial fluid, thickening or pericardial calcification. Aortic
atherosclerosis. No definite calcifications are identified within
the coronary arteries.

Mediastinum/Nodes: No pathologically enlarged mediastinal or hilar
lymph nodes. Esophagus is unremarkable in appearance. No axillary
lymphadenopathy. However, there are several right axillary lymph
nodes measuring up to at 8 mm in size which are conspicuous but
nonspecific.

Lungs/Pleura: Linear scarring in the right middle lobe. No
suspicious appearing pulmonary nodules or masses are noted. No acute
consolidative airspace disease. No pleural effusions.

Upper Abdomen: Aortic atherosclerosis.

Musculoskeletal: Postoperative changes of lumpectomy are noted in
the lateral aspect of the right breast. There is a postoperative
area of architectural distortion in this region. There are no
aggressive appearing lytic or blastic lesions noted in the
visualized portions of the skeleton.
IMPRESSION: 1. Postoperative changes of right breast lumpectomy. Multiple
borderline enlarged right axillary lymph nodes measuring up to 8 mm
in short axis are nonspecific and may be reactive. No other definite
signs to suggest metastatic disease in the thorax.
2. Aortic atherosclerosis.

## 2019-01-24 MED FILL — ANASTROZOLE 1 MG TABLET: 1 | 90 days supply | Qty: 90 | Fill #2

## 2019-02-16 ENCOUNTER — Telehealth: Payer: Self-pay | Admitting: Oncology

## 2019-02-16 NOTE — Telephone Encounter (Signed)
Returned patient's phone call regarding rescheduling March 2021 appointment, per patient's request appointment has moved to November 2020. Md approved.

## 2019-03-07 NOTE — Progress Notes (Signed)
[place]  [phone] Fax:([phone]) [phone]     ID: Kelly Yu DOB: 07/21/1955  MR#: [id number]  [id number]  Patient Care Team: Patient, No Pcp Per as PCP - General (General Practice) Kelly Yu, Kelly Dad, MD as Consulting Physician (Oncology) Kelly Bookbinder, MD as Consulting Physician (General Surgery) Kelly Rudd, MD as Consulting Physician (Radiation Oncology) Kelly Bison Charlestine Massed, NP as Nurse Practitioner (Hematology and Oncology) Kelly Cruel, MD OTHER MD:  CHIEF COMPLAINT: Invasive ductal carcinoma, unknown prognostic panel  CURRENT TREATMENT: anastrozole   INTERVAL HISTORY: Renisha returns today for follow-up of her estrogen receptor positive breast cancer accompanied by her daughter and arabic Optometrist.  She continues on anastrozole.  She tolerates this with no side effects that she is aware of.  Since her last visit, she underwent bilateral diagnostic mammography with tomography at [facility] on 07/19/2018 showing: breast density category B; no evidence of malignancy in either breast.   REVIEW OF SYSTEMS: [name] has been having mild headaches.  They tend to occur in the evening.  They are associated with a little bit of neck discomfort which is relieved by massage.  She has not had significant visual changes.  There has been no nausea or vomiting.  There have been no problem with balance and no problem with falls.  A detailed review of systems today was otherwise stable.  BREAST CANCER HISTORY: On the original intake note:  Kelly Yu tells me she had pain in the right breast, which brought her to a physician in [place], where she normally resides. They obtained a mammogram, but she does not know those results. On 11/21/2015 she underwent right lumpectomy without sentinel lymph node sampling  and the pathology report (read from the patient's daughters I phoned) describes a 1.8 cm invasive ductal carcinoma in the upper-outer  quadrant, grade 3, with negative margins. There is no prognostic panel  More recently, while visiting family in [place], the patient presented to the emergency department with a complaint of right breast pain and redness. She was afebrile. Needle aspiration was obtained and the patient was started on Augmentin. The aspirate cultures remain negative.  On 12/22/2015 she had a right breast ultrasound at the [facility]. This showed, at the 11:00 position of the right breast, 8 cm from the nipple, a large complex cystic region measuring up to 8.5 cm. There was no hyperemia.   The patient was referred to surgery, and on 01/18/2016 she had bilateral diagnostic [facility]ography with [facility] The breast density was category B. In the upper outer right breast there was a 5.7 cm mass, with an associated 1 cm satellite. There was a large firm mass deforming the contour of the breast and targeted ultrasound demonstrated a large complex collection in the upper outer quadrant of the breast measuring up to 4.8 cm. A borderline lymph node with a moderately thickened cortex was visualized in the right axilla.  Biopsy of the right breast mass and right axillary lymph node 01/25/2016 showed (SAA [id number]) no evidence of malignancy. Cultures from the 01/25/2016 biopsy all remained negative.  She was referred to radiation oncology to receive adjuvant radiation. Her subsequent history is as detailed below   PAST MEDICAL HISTORY: Past Medical History:  Diagnosis Date  . Breast cancer ([place])    Diagnosed in [place] in July 2017, Stage IA, T1c, Nx, Mx invasive ductal carcinoma  . Ulcer     PAST SURGICAL HISTORY: Past Surgical History:  Procedure Laterality Date  . right breast lumpectomy      FAMILY HISTORY Family History  Problem Relation Age of Onset  . Diabetes Sister   The patient's father died from renal failure in his 81s the patient's mother died  from a blood clot in her 29s. The patient had 4 brothers, 8 sisters. There is no history of breast or ovarian cancer in the family to the patient's knowledge   GYNECOLOGIC HISTORY:  No LMP recorded. Patient is postmenopausal. Menarche age 29, first live birth age 83, the patient is GX B8. She is now postmenopausal. She never took hormone replacement.   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 While visiting in the [place] she lives with her daughter Garrel Yu. 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 Also at home are the patient's son-in-law Kelly Yu , who works for [employer], and their 3 children.    ADVANCED DIRECTIVES: The patient's daughter Kelly Yu and son-in-law Kelly Yu are jointly the patient's healthcare power of attorney    HEALTH MAINTENANCE: Social History   Tobacco Use  . Smoking status: Never Smoker  . Smokeless tobacco: Never Used  Substance Use Topics  . Alcohol use: No  . Drug use: No     Colonoscopy:  PAP: 01/18/2016 / benign, + HPV  Bone density:   No Known Allergies  Current Outpatient Medications  Medication Sig Dispense Refill  . anastrozole (ARIMIDEX) 1 MG tablet Take 1 tablet (1 mg total) by mouth daily. 180 tablet 1  . cholecalciferol (VITAMIN D) 1000 units tablet Take 1 tablet (1,000 Units total) by mouth daily. 90 tablet 4  . triamcinolone cream (KENALOG) 0.1 % Apply 1 application topically 2 (two) times daily. 30 g 0   No current facility-administered medications for this visit.     OBJECTIVE: Middle-aged Middle [place] woman in no acute distress  Vitals:   03/08/19 1029  BP: (!) 169/78  Pulse: 66  Resp: 18  Temp: 98 F (36.7 C)  SpO2: 99%     Body mass index is 26.95 kg/m.    ECOG FS:1 - Symptomatic but completely ambulatory  Sclerae unicteric, EOMs intact Wearing a mask No cervical or supraclavicular adenopathy Lungs no rales or rhonchi Heart regular rate and rhythm Abd soft,  nontender, positive bowel sounds MSK no focal spinal tenderness, no upper extremity lymphedema Neuro: nonfocal, well oriented, appropriate affect Breasts: The right breast is status post lumpectomy and radiation.  There are significant irregularities including a hard mass associated with the scar and a second mass in the lower inner quadrant.  The left breast is unremarkable.  Both axillae are benign   LAB RESULTS:  CMP     Component Value Date/Time   NA 141 07/06/2018 0811   NA 141 12/15/2016 1606   K 4.7 07/06/2018 0811   K 4.1 12/15/2016 1606   CL 107 07/06/2018 0811   CO2 25 07/06/2018 0811   CO2 27 12/15/2016 1606   GLUCOSE 98 07/06/2018 0811   GLUCOSE 124 12/15/2016 1606   BUN 16 07/06/2018 0811   BUN 17.1 12/15/2016 1606   CREATININE 0.74 07/06/2018 0811   CREATININE 0.8 12/15/2016 1606   CALCIUM 9.2 07/06/2018 0811   CALCIUM 9.3 12/15/2016 1606   PROT 7.3 07/06/2018 0811   PROT 7.5 12/15/2016 1606   ALBUMIN 3.8 07/06/2018 0811   ALBUMIN 3.7 12/15/2016 1606   AST 18 07/06/2018 0811   AST 21 12/15/2016 1606  ALT 10 07/06/2018 0811   ALT 16 12/15/2016 1606   ALKPHOS 91 07/06/2018 0811   ALKPHOS 103 12/15/2016 1606   BILITOT 0.4 07/06/2018 0811   BILITOT 0.36 12/15/2016 1606   GFRNONAA >60 07/06/2018 0811   GFRAA >60 07/06/2018 0811    INo results found for: SPEP, UPEP  Lab Results  Component Value Date   WBC 6.8 03/08/2019   NEUTROABS 3.9 03/08/2019   HGB 12.0 03/08/2019   HCT 36.7 03/08/2019   MCV 90.2 03/08/2019   PLT 204 03/08/2019      Chemistry      Component Value Date/Time   NA 141 07/06/2018 0811   NA 141 12/15/2016 1606   K 4.7 07/06/2018 0811   K 4.1 12/15/2016 1606   CL 107 07/06/2018 0811   CO2 25 07/06/2018 0811   CO2 27 12/15/2016 1606   BUN 16 07/06/2018 0811   BUN 17.1 12/15/2016 1606   CREATININE 0.74 07/06/2018 0811   CREATININE 0.8 12/15/2016 1606      Component Value Date/Time   CALCIUM 9.2 07/06/2018 0811   CALCIUM 9.3  12/15/2016 1606   ALKPHOS 91 07/06/2018 0811   ALKPHOS 103 12/15/2016 1606   AST 18 07/06/2018 0811   AST 21 12/15/2016 1606   ALT 10 07/06/2018 0811   ALT 16 12/15/2016 1606   BILITOT 0.4 07/06/2018 0811   BILITOT 0.36 12/15/2016 1606       No results found for: LABCA2  No components found for: LABCA125  No results for input(s): INR in the last 168 hours.  Urinalysis    Component Value Date/Time   COLORURINE YELLOW 11/13/2012 1059   APPEARANCEUR CLEAR 11/13/2012 1059   LABSPEC 1.010 11/13/2012 1059   PHURINE 8.5 (H) 11/13/2012 1059   GLUCOSEU NEGATIVE 11/13/2012 1059   HGBUR NEGATIVE 11/13/2012 1059   BILIRUBINUR NEGATIVE 11/13/2012 1059   KETONESUR NEGATIVE 11/13/2012 1059   PROTEINUR NEGATIVE 11/13/2012 1059   UROBILINOGEN 0.2 11/13/2012 1059   NITRITE NEGATIVE 11/13/2012 1059   LEUKOCYTESUR TRACE (A) 11/13/2012 1059     STUDIES: No results found.   ELIGIBLE FOR AVAILABLE RESEARCH PROTOCOL: No  ASSESSMENT: 63 y.o. [place] Woman, originally from [place], status post right breast upper outer quadrant lumpectomy July 2017 for a pT1c pNX, stage IA invasive ductal carcinoma,  grade 3, prognostic panel not available  (1) complex cystic mass in the right breast and suspicious right axillary lymph node both biopsied 01/25/2016, showing no evidence of malignancy. Multiple cultures were negative as well   (2) adjuvant radiation 03/23/2016 to 05/15/2015:  (a) Right Breast / 50.4 Gy in 28 fractions  (b) Boost / 14 Gy in 7 fractions  (3) started anastrozole 06/05/2016  (a) bone density at the [facility] 12/08/2016 finds a T score of -1.5  PLAN: Lynelle is now a little over 3 years out from definitive surgery for breast cancer with no evidence of disease recurrence.  This is very favorable.  She is tolerating anastrozole well and the plan is to continue that for a minimum of 5 years.  She will be leaving for [place] 03/19/2019.  It is not clear when she will be  able to obtain a visa to return.  I have refilled her anastrozole here so she has a 51-month supply and I have written her a paper prescription that she can take to [place] and get it refilled there.  I also gave her my card so her doctors in [place] can call me if they have  any questions.  I am concerned about the irregularities in the right breast and I have put her in for her mammogram and ultrasound this week.  If they can do the left breast that will save her having to have mammography again in March since he will not be in the country at that time  Assuming she is back in the [place] a year from now she will see me then.  She knows to call for any other issue that may develop before that visit.  Kelly Yu. Kenneth Lax, MD  03/08/19 10:38 AM Medical Oncology and Hematology [facility] [place], [place] [address] Tel. [phone]    Fax. [phone]  . I, Wilburn Mylar, am acting as scribe for Dr. Sarajane Jews C. Wilba Mutz.  I, Lurline Del MD, have reviewed the above documentation for accuracy and completeness, and I agree with the above.

## 2019-03-08 ENCOUNTER — Other Ambulatory Visit: Payer: Self-pay

## 2019-03-08 ENCOUNTER — Telehealth: Payer: Self-pay | Admitting: *Deleted

## 2019-03-08 ENCOUNTER — Inpatient Hospital Stay: Payer: Self-pay | Attending: Oncology

## 2019-03-08 ENCOUNTER — Inpatient Hospital Stay (HOSPITAL_BASED_OUTPATIENT_CLINIC_OR_DEPARTMENT_OTHER): Payer: Self-pay | Admitting: Oncology

## 2019-03-08 VITALS — BP 169/78 | HR 66 | Temp 98.0°F | Resp 18 | Wt 157.0 lb

## 2019-03-08 DIAGNOSIS — Z79899 Other long term (current) drug therapy: Secondary | ICD-10-CM | POA: Insufficient documentation

## 2019-03-08 DIAGNOSIS — Z79811 Long term (current) use of aromatase inhibitors: Secondary | ICD-10-CM | POA: Insufficient documentation

## 2019-03-08 DIAGNOSIS — Z170 Estrogen receptor positive status [ER+]: Secondary | ICD-10-CM

## 2019-03-08 DIAGNOSIS — Z923 Personal history of irradiation: Secondary | ICD-10-CM | POA: Insufficient documentation

## 2019-03-08 DIAGNOSIS — A630 Anogenital (venereal) warts: Secondary | ICD-10-CM | POA: Insufficient documentation

## 2019-03-08 DIAGNOSIS — R519 Headache, unspecified: Secondary | ICD-10-CM | POA: Insufficient documentation

## 2019-03-08 DIAGNOSIS — C50411 Malignant neoplasm of upper-outer quadrant of right female breast: Secondary | ICD-10-CM | POA: Insufficient documentation

## 2019-03-08 DIAGNOSIS — Z780 Asymptomatic menopausal state: Secondary | ICD-10-CM | POA: Insufficient documentation

## 2019-03-08 LAB — CBC WITH DIFFERENTIAL/PLATELET
Abs Immature Granulocytes: 0.03 10*3/uL (ref 0.00–0.07)
Basophils Absolute: 0 10*3/uL (ref 0.0–0.1)
Basophils Relative: 1 %
Eosinophils Absolute: 0.2 10*3/uL (ref 0.0–0.5)
Eosinophils Relative: 2 %
HCT: 36.7 % (ref 36.0–46.0)
Hemoglobin: 12 g/dL (ref 12.0–15.0)
Immature Granulocytes: 0 %
Lymphocytes Relative: 28 %
Lymphs Abs: 1.9 10*3/uL (ref 0.7–4.0)
MCH: 29.5 pg (ref 26.0–34.0)
MCHC: 32.7 g/dL (ref 30.0–36.0)
MCV: 90.2 fL (ref 80.0–100.0)
Monocytes Absolute: 0.8 10*3/uL (ref 0.1–1.0)
Monocytes Relative: 12 %
Neutro Abs: 3.9 10*3/uL (ref 1.7–7.7)
Neutrophils Relative %: 57 %
Platelets: 204 10*3/uL (ref 150–400)
RBC: 4.07 MIL/uL (ref 3.87–5.11)
RDW: 13.4 % (ref 11.5–15.5)
WBC: 6.8 10*3/uL (ref 4.0–10.5)
nRBC: 0 % (ref 0.0–0.2)

## 2019-03-08 LAB — COMPREHENSIVE METABOLIC PANEL
ALT: 12 U/L (ref 0–44)
AST: 19 U/L (ref 15–41)
Albumin: 3.9 g/dL (ref 3.5–5.0)
Alkaline Phosphatase: 85 U/L (ref 38–126)
Anion gap: 8 (ref 5–15)
BUN: 15 mg/dL (ref 8–23)
CO2: 26 mmol/L (ref 22–32)
Calcium: 8.9 mg/dL (ref 8.9–10.3)
Chloride: 106 mmol/L (ref 98–111)
Creatinine, Ser: 0.75 mg/dL (ref 0.44–1.00)
GFR calc Af Amer: >60 mL/min (ref >60)
GFR calc non Af Amer: >60 mL/min (ref >60)
Glucose, Bld: 103 mg/dL — ABNORMAL HIGH (ref 70–99)
Potassium: 4.2 mmol/L (ref 3.5–5.1)
Sodium: 140 mmol/L (ref 135–145)
Total Bilirubin: 0.4 mg/dL (ref 0.3–1.2)
Total Protein: 7.7 g/dL (ref 6.5–8.1)

## 2019-03-08 MED ORDER — ANASTROZOLE 1 MG PO TABS
1.0000 mg | ORAL_TABLET | Freq: Every day | ORAL | 1 refills | Status: DC
Start: 2019-03-08 — End: 2020-04-16

## 2019-03-08 NOTE — Telephone Encounter (Signed)
This RN contacted [facility] and scheduled mammogram with [place] ( of right breast ) asap due to noted palpable nodules.  Pt will be leaving the country 03/19/2019 and likely would not return for greater then 6 months which would be past her routine time for bilateral mammogram.  Ideally it would be beneficial per above to have bilateral mammo with right [place] with possible biopsy.  Appointment scheduled for 03/11/2019 at [place].  This RN contacted the patient's daughter - [name], who has good command of english with information. Verbalization of understanding obtained.  Orders will be faxed by this RN.

## 2019-03-09 ENCOUNTER — Telehealth: Payer: Self-pay | Admitting: Oncology

## 2019-03-09 NOTE — Telephone Encounter (Signed)
I left a message regarding schedule  

## 2019-03-11 ENCOUNTER — Encounter (HOSPITAL_COMMUNITY): Payer: Self-pay

## 2019-03-16 ENCOUNTER — Other Ambulatory Visit: Payer: Self-pay

## 2019-03-16 ENCOUNTER — Ambulatory Visit (HOSPITAL_COMMUNITY)
Admission: RE | Admit: 2019-03-16 | Discharge: 2019-03-16 | Disposition: A | Discharge status: Home / Self Care | Payer: Self-pay | Source: Ambulatory Visit | Attending: Obstetrics and Gynecology | Admitting: Obstetrics and Gynecology

## 2019-03-16 ENCOUNTER — Other Ambulatory Visit: Payer: Self-pay | Admitting: Radiology

## 2019-03-16 ENCOUNTER — Encounter (HOSPITAL_COMMUNITY): Payer: Self-pay

## 2019-03-16 ENCOUNTER — Other Ambulatory Visit: Payer: Self-pay | Admitting: Obstetrics and Gynecology

## 2019-03-16 DIAGNOSIS — Z01419 Encounter for gynecological examination (general) (routine) without abnormal findings: Secondary | ICD-10-CM | POA: Insufficient documentation

## 2019-03-16 DIAGNOSIS — Z1239 Encounter for other screening for malignant neoplasm of breast: Secondary | ICD-10-CM | POA: Insufficient documentation

## 2019-03-16 MED FILL — ANASTROZOLE 1 MG TABLET: 1 | 90 days supply | Qty: 90 | Fill #0

## 2019-03-16 NOTE — Addendum Note (Signed)
Encounter addended by: Loletta Parish, RN on: 03/16/2019 1:46 PM  Actions taken: Problem List modified, Visit diagnoses modified, Problem List reviewed

## 2019-03-16 NOTE — Patient Instructions (Signed)
Explained breast self awareness with Marcille Blanco. Let patient know that her next Pap smear will be based on the result of today's Pap smear. Referred patient to [facility] for a right breast biopsy per recommendation. Appointment scheduled for Wednesday, March 16, 2019 at 1145.     Patient aware of appointment and will be there. Let patient know will follow up with her within the next couple weeks with results of Pap smear by letter or phone. Mailani Hoey verbalized understanding.  Noelly Lasseigne, Arvil Chaco, RN 12:48 PM

## 2019-03-16 NOTE — Progress Notes (Signed)
Patient referred to [facility] by [facility] due to recommending a right breast biopsy. Diagnostic mammogram was completed 03/11/2019. Patient has a history of right breast cancer 01/25/2016.  Pap Smear: Pap smear completed today. Last Pap smear was 01/18/2016 at [facility] and normal with positive HPV. Per patient has no history of an abnormal Pap smear. Last Pap smear result is in Epic.  Physical exam: Breasts Left breast slightly larger than right breast due to history of right breast lumpectomy in July 2017. No skin abnormalities bilateral breasts. No nipple retraction bilateral breasts. No nipple discharge bilateral breasts. No lymphadenopathy. No lumps palpated bilateral breasts. No complaints of pain or tenderness on exam. Referred patient to [facility] for a right breast biopsy per recommendation. Appointment scheduled for Wednesday, March 16, 2019 at 1145.        Pelvic/Bimanual   Ext Genitalia No lesions, no swelling and no discharge observed on external genitalia.         Vagina Vagina pink and normal texture. No lesions or discharge observed in vagina.          Cervix Cervix is present. Cervix pink and of normal texture. No discharge observed.     Uterus Uterus is present and palpable. Uterus in normal position and normal size.        Adnexae Bilateral ovaries present and palpable. No tenderness on palpation.         Rectovaginal No rectal exam completed today since patient had no rectal complaints. No skin abnormalities observed on exam.    Smoking History: Patient has never smoked.  Patient Navigation: Patient education provided. Access to services provided for patient through [facility] program. Arabic interpreter provided.   Colorectal Cancer Screening: Per patient has never had a colonoscopy completed. No complaints today.   Breast and Cervical Cancer Risk Assessment: Patient has no family history of breast cancer. Patient has a personal history of  breast cancer. Patient has no known genetic mutations or history of radiation treatment to the chest before age 24. Patient has no history of cervical dysplasia, immunocompromised, or DES exposure in-utero.  Risk Assessment    Risk Scores      03/16/2019   Last edited by: Loletta Parish, RN   5-year risk: 1.6 %   Lifetime risk: 6.1 %         Used Arabic interpreter Alveta Heimlich from [employer].

## 2019-03-18 LAB — CYTOLOGY - PAP
Comment: NEGATIVE
Diagnosis: NEGATIVE
High risk HPV: NEGATIVE

## 2019-03-18 LAB — CERVICOVAGINAL ANCILLARY ONLY: HPV: NOT DETECTED

## 2019-04-18 ENCOUNTER — Encounter (HOSPITAL_COMMUNITY): Payer: Self-pay

## 2019-04-18 ENCOUNTER — Telehealth (HOSPITAL_COMMUNITY): Payer: Self-pay | Admitting: *Deleted

## 2019-04-18 NOTE — Telephone Encounter (Signed)
Normal Pap smear result letter mailed to patient.

## 2019-05-03 ENCOUNTER — Other Ambulatory Visit: Payer: Self-pay

## 2019-05-03 ENCOUNTER — Ambulatory Visit: Payer: Self-pay | Admitting: Oncology

## 2019-07-06 ENCOUNTER — Other Ambulatory Visit: Payer: Self-pay

## 2019-07-06 ENCOUNTER — Ambulatory Visit: Payer: Self-pay | Admitting: Oncology

## 2019-07-06 MED FILL — ANASTROZOLE 1 MG TABLET: 1 | 90 days supply | Qty: 90 | Fill #1

## 2019-11-17 MED FILL — ANASTROZOLE 1 MG TABLET: 1 | 90 days supply | Qty: 90 | Fill #2

## 2020-03-07 ENCOUNTER — Other Ambulatory Visit: Payer: Self-pay

## 2020-03-07 ENCOUNTER — Encounter (HOSPITAL_COMMUNITY): Payer: Self-pay

## 2020-03-07 DIAGNOSIS — Z170 Estrogen receptor positive status [ER+]: Secondary | ICD-10-CM

## 2020-03-08 ENCOUNTER — Inpatient Hospital Stay: Payer: Self-pay

## 2020-03-08 ENCOUNTER — Inpatient Hospital Stay: Payer: Self-pay | Attending: Oncology | Admitting: Oncology

## 2020-04-16 ENCOUNTER — Other Ambulatory Visit: Payer: Self-pay | Admitting: Oncology

## 2020-04-16 ENCOUNTER — Other Ambulatory Visit: Payer: Self-pay | Admitting: *Deleted

## 2020-04-16 DIAGNOSIS — Z170 Estrogen receptor positive status [ER+]: Secondary | ICD-10-CM

## 2020-04-16 MED ORDER — ANASTROZOLE 1 MG PO TABS: 1.0000 mg | ORAL_TABLET | Freq: Every day | ORAL | 1 refills | Status: DC

## 2020-04-16 MED FILL — ANASTROZOLE 1 MG TABLET: 1 | 90 days supply | Qty: 90 | Fill #0

## 2020-04-16 NOTE — Telephone Encounter (Signed)
Per lobby visit by the patient's brother- he requested refill on anastrazole per pt has been unable to return to the [place] due to Covid and travel restrictions.  Prescription obtained  From MD and sent to verified pharmacy per patient's brother.

## 2020-07-06 ENCOUNTER — Telehealth: Payer: Self-pay | Admitting: Adult Health

## 2020-07-06 NOTE — Telephone Encounter (Signed)
Scheduled appointment per 03/02schedule message. Contacted patients husband, patient is aware.

## 2020-07-13 ENCOUNTER — Other Ambulatory Visit: Payer: Self-pay

## 2020-07-13 ENCOUNTER — Inpatient Hospital Stay: Payer: Self-pay | Attending: Adult Health | Admitting: Physician Assistant

## 2020-07-13 DIAGNOSIS — C50411 Malignant neoplasm of upper-outer quadrant of right female breast: Secondary | ICD-10-CM

## 2020-07-13 DIAGNOSIS — Z79811 Long term (current) use of aromatase inhibitors: Secondary | ICD-10-CM | POA: Insufficient documentation

## 2020-07-13 DIAGNOSIS — Z170 Estrogen receptor positive status [ER+]: Secondary | ICD-10-CM | POA: Insufficient documentation

## 2020-07-13 DIAGNOSIS — Z923 Personal history of irradiation: Secondary | ICD-10-CM | POA: Insufficient documentation

## 2020-07-13 DIAGNOSIS — C50911 Malignant neoplasm of unspecified site of right female breast: Secondary | ICD-10-CM | POA: Insufficient documentation

## 2020-07-13 NOTE — Progress Notes (Signed)
[facility] OFFICE PROGRESS NOTE  Patient, No Pcp Per No address on file  ID: Madilynn Montante DOB: 19-Nov-1955  MR#: [id number]  OVZ#:[id number]  Patient Care Team: Patient, No Pcp Per as PCP - General (General Practice) [name], Virgie Dad, MD as Consulting Physician (Oncology) Rolm Bookbinder, MD as Consulting Physician (General Surgery) Kyung Rudd, MD as Consulting Physician (Radiation Oncology) Delice Bison, Charlestine Massed, NP as Nurse Practitioner (Hematology and Oncology) Chauncey Cruel, MD OTHER MD:  DIAGNOSIS: Invasive ductal carcinoma, unknown prognostic panel  CURRENT THERAPY: anastrozole  INTERVAL HISTORY: Makayle Krahn 65 y.o. female returns to the clinic today for follow-up visit accompanied by her daughter.  The patient was last seen in the clinic in November 2020.  The patient went to [place] at that time and just returned a few weeks ago.  The patient continues to take anastrozole daily without any noticeable adverse side effects.  She denies any fatigue, nausea, vomiting, diarrhea, constipation, rashes or skin changes, arthralgias, or hot flashes.  She does report that she has an area on the lateral right breast that is tender to palpation.  This started approximately 2 to 3 months ago. The discomfort is worse with laying on her right side. She denies any overlying skin changes, nipple discharge, breast lumps, or overlying skin changes. She does her breast exams in the slower. The patient's last mammogram was on 07/19/18. Her last dexa scan was 12/08/2016. She does not have a PCP.    BREAST CANCER HISTORY: On the original intake note:  Sharada tells me she had pain in the right breast, which brought her to a physician in [place], where she normally resides. They obtained a mammogram, but she does not know those results. 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 There is no prognostic panel  More recently, while visiting family in [place], the patient presented to the emergency department with a complaint of right breast pain and redness. She was afebrile. Needle aspiration was obtained and the patient was started on Augmentin. The aspirate cultures remain negative.  On 12/22/2015 she had a right breast ultrasound at the [facility]. This showed, at the 11:00 position of the right breast, 8 cm from the nipple, a large complex cystic region measuring up to 8.5 cm. There was no hyperemia.   The patient was referred to surgery, and on 01/18/2016 she had bilateral diagnostic mammography with [facility] The breast density was category B. In the upper outer right breast there was a 5.7 cm mass, with an associated 1 cm satellite. There was a large firm mass deforming the contour of the breast and targeted ultrasound demonstrated a large complex collection in the upper outer quadrant of the breast measuring up to 4.8 cm. A borderline lymph node with a moderately thickened cortex was visualized in the right axilla.  Biopsy of the right breast mass and right axillary lymph node 01/25/2016 showed ([id number]) no evidence of malignancy. Cultures from the 01/25/2016 biopsy all remained negative.  She was referred to radiation oncology to receive adjuvant radiation. Her subsequent history is as detailed below   MEDICAL HISTORY: Past Medical History:  Diagnosis Date  . Breast cancer ([place])    Diagnosed in [place] in July 2017, Stage IA, T1c, Nx, Mx invasive  ductal carcinoma  . Ulcer     ALLERGIES:  has No Known Allergies.  MEDICATIONS:  Current Outpatient Medications  Medication Sig Dispense Refill  . anastrozole (ARIMIDEX) 1 MG tablet Take 1 tablet (1 mg total) by mouth daily. 180  tablet 1  . cholecalciferol (VITAMIN D) 1000 units tablet Take 1 tablet (1,000 Units total) by mouth daily. 90 tablet 4  . triamcinolone cream (KENALOG) 0.1 % Apply 1 application topically 2 (two) times daily. 30 g 0   No current facility-administered medications for this visit.    SURGICAL HISTORY:  Past Surgical History:  Procedure Laterality Date  . right breast lumpectomy      REVIEW OF SYSTEMS:   Review of Systems  Constitutional: Negative for appetite change, chills, fatigue, fever and unexpected weight change.  HENT: Negative for mouth sores, nosebleeds, sore throat and trouble swallowing.   Eyes: Negative for eye problems and icterus.  Respiratory: Negative for cough, hemoptysis, shortness of breath and wheezing.   Cardiovascular: Positive for lateral right breast tenderness. Negative for chest pain and leg swelling.  Gastrointestinal: Negative for abdominal pain, constipation, diarrhea, nausea and vomiting.  Genitourinary: Negative for bladder incontinence, difficulty urinating, dysuria, frequency and hematuria.   Musculoskeletal: Negative for back pain, gait problem, neck pain and neck stiffness.  Skin: Negative for itching and rash.  Neurological: Negative for dizziness, extremity weakness, gait problem, headaches, light-headedness and seizures.  Hematological: Negative for adenopathy. Does not bruise/bleed easily.  Psychiatric/Behavioral: Negative for confusion, depression and sleep disturbance. The patient is not nervous/anxious.     PHYSICAL EXAMINATION:  There were no vitals taken for this visit.  ECOG PERFORMANCE STATUS: 1 - Symptomatic but completely ambulatory  Physical Exam  Constitutional: Oriented to person, place, and time and well-developed, well-nourished, and in no distress.  HENT:  Head: Normocephalic and atraumatic.  Mouth/Throat: Oropharynx is clear and moist. No oropharyngeal exudate.  Eyes: Conjunctivae are normal. Right eye exhibits no discharge.  Left eye exhibits no discharge. No scleral icterus.  Neck: Normal range of motion. Neck supple.  Cardiovascular: Normal rate, regular rhythm, normal heart sounds and intact distal pulses.   Pulmonary/Chest: Effort normal and breath sounds normal. No respiratory distress. No wheezes. No rales.  Abdominal: Soft. Bowel sounds are normal. Exhibits no distension and no mass. There is no tenderness.  Musculoskeletal: Normal range of motion. Exhibits no edema.  Lymphadenopathy:    No cervical adenopathy.  Neurological: Alert and oriented to person, place, and time. Exhibits normal muscle tone. Gait normal. Coordination normal.  Skin: Skin is warm and dry. No rash noted. Not diaphoretic. No erythema. No pallor.  Breast: Tenderness to palpitation over lateral right chest wall/lateral breast ~7-8 o'clock position. No overlying skin changes. No lumps or nipple discharge. Previous lumpectomy scar noted. Left breast benign. Psychiatric: Mood, memory and judgment normal.  Vitals reviewed.   GYNECOLOGIC HISTORY:  No LMP recorded. Patient is postmenopausal. Menarche age 70, first live birth age 18, the patient is GX B8. She is now postmenopausal. She never took hormone replacement.   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 While visiting in the [place] she lives with her daughter [name]. 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 Also at home are the patient's son-in-law [name] , who works for [employer], and their 3 children.  ADVANCED DIRECTIVES: The patient's daughter [name] and son-in-law [name] are jointly the patient's healthcare power of attorney   LABORATORY DATA: Lab Results  Component Value Date   WBC 6.8 03/08/2019   HGB 12.0 03/08/2019   HCT 36.7 03/08/2019   MCV 90.2 03/08/2019   PLT 204 03/08/2019      Chemistry      Component Value Date/Time   NA 140 03/08/2019 1017   NA 141 12/15/2016 1606   K  4.2 03/08/2019 1017   K 4.1 12/15/2016 1606   CL 106 03/08/2019 1017   CO2 26 03/08/2019 1017   CO2 27 12/15/2016 1606   BUN 15 03/08/2019 1017   BUN 17.1 12/15/2016 1606   CREATININE 0.75 03/08/2019 1017   CREATININE 0.8 12/15/2016 1606      Component Value Date/Time   CALCIUM 8.9 03/08/2019 1017   CALCIUM 9.3 12/15/2016 1606   ALKPHOS 85 03/08/2019 1017   ALKPHOS 103 12/15/2016 1606   AST 19 03/08/2019 1017   AST 21 12/15/2016 1606   ALT 12 03/08/2019 1017   ALT 16 12/15/2016 1606   BILITOT 0.4 03/08/2019 1017   BILITOT 0.36 12/15/2016 1606       RADIOGRAPHIC STUDIES:  No results found.   ASSESSMENT/PLAN:  ELIGIBLE FOR AVAILABLE RESEARCH PROTOCOL: No  ASSESSMENT: 65 y.o. [place] Woman, originally from [place], status post right breast upper outer quadrant lumpectomy July 2017 for a pT1c pNX, stage IA invasive ductal carcinoma,  grade 3, prognostic panel not available  (1) complex cystic mass in the right breast and suspicious right axillary lymph node both biopsied 01/25/2016, showing no evidence of malignancy. Multiple cultures were negative as well   (2) adjuvant radiation 03/23/2016 to 05/15/2015:             (a) Right Breast / 50.4 Gy in 28 fractions             (b) Boost / 14 Gy in 7 fractions  (3) started anastrozole 06/05/2016             (a) bone density at the [facility] 12/08/2016 finds a T score of -1.5   Plan:  The patient recently returned from [place]. She has not had a mammogram since 2020. I will arrange for a repeat mammogram to be performed at [facility]. Due to the tenderness to palpation over the lateral right chest wall, I will arrange for the patient to have an ultrasound to evaluate this area. No overlying skin changes in this area.   The patient does not have a PCP. She has not had a DEXA scan since 2018. I will also arrange for her to have a DEXA scan performed.   She will continue to take anastrozole.   I will discuss with her  primary oncologist the recommended follow up for this patient based on the results of her mammogram which will hopefully be done in the near future. Assuming her results are benign, I will likely arrange for a follow up visit in 1 year. I gave the patient and her daughter the number to [facility] to ensure that her mammogram and DEXA scan gets scheduled. We have faxed over the request to [facility] for Mammogram w/ u/s and Dexa scan.   The patient was advised to call immediately if she has any concerning symptoms in the interval. The patient voices understanding of current disease status and treatment options and is in agreement with the current care plan. All questions were answered. The patient knows to  call the clinic with any problems, questions or concerns. We can certainly see the patient much sooner if necessary      I spent 20-29 minutes in this encounter.   Klaryssa Fauth L Prestyn Stanco, PA-C 07/13/20

## 2020-08-30 ENCOUNTER — Ambulatory Visit: Payer: Self-pay | Admitting: *Deleted

## 2020-08-30 ENCOUNTER — Other Ambulatory Visit: Payer: Self-pay

## 2020-08-30 VITALS — BP 194/100 | Wt 148.0 lb

## 2020-08-30 DIAGNOSIS — N6311 Unspecified lump in the right breast, upper outer quadrant: Secondary | ICD-10-CM

## 2020-08-30 DIAGNOSIS — Z1239 Encounter for other screening for malignant neoplasm of breast: Secondary | ICD-10-CM

## 2020-08-30 DIAGNOSIS — N644 Mastodynia: Secondary | ICD-10-CM

## 2020-08-30 NOTE — Progress Notes (Signed)
[name] is a 65 y.o. female who presents to [facility] clinic today with complaint of right outer breast pain x 4-6 months that comes and goes. Patient rates the pain at a 7-8 out of 10.    Pap Smear: Pap smear not completed today. Last Pap smear was 03/16/2019 at [facility] clinic and was normal with negative HPV. Per patient has no history of an abnormal Pap smear. Last Pap smear result is available in Epic.   Physical exam: Breasts Left breast slightly larger than right breast due to history of right breast lumpectomy in July 2017. No skin abnormalities bilateral breasts. No nipple retraction bilateral breasts. No nipple discharge bilateral breasts. No lymphadenopathy. No lumps palpated left breast. Palpated a lump within the right breast at 10 o'clock 7 cm from the nipple. Complaints of tenderness when palpated right breast lump.   Pelvic/Bimanual Pap is not indicated today per BCCCP guidelines.   Smoking History: Patient has never smoked.   Patient Navigation: Patient education provided. Access to services provided for patient through [employer] program. Arabic interpreter Alveta Heimlich from [employer] provided.   Colorectal Cancer Screening: Per patient has never had colonoscopy completed. No complaints today.    Breast and Cervical Cancer Risk Assessment: Patient has no family history of breast cancer. Patient has a personal history of breast cancer. Patient has no known genetic mutations or history of radiation treatment to the chest before age 51. Patient has no history of cervical dysplasia, immunocompromised, or DES exposure in-utero.  Risk Assessment    Risk Scores      08/30/2020 03/16/2019   Last edited by: Demetrius Revel, LPN Carollyn Etcheverry, Heath Gold, RN   5-year risk: 1.6 % 1.6 %   Lifetime risk: 5.8 % 6.1 %          A: BCCCP exam without pap smear Complaint of right breast pain.  P: Referred patient to [facility] for a diagnostic mammogram and right  breast ultrasound. Appointment scheduled Thursday, Sep 06, 2020 at 1115.  Loletta Parish, RN 08/30/2020 11:10 AM

## 2020-08-30 NOTE — Patient Instructions (Signed)
Explained breast self awareness with Marcille Blanco. Patient did not need a Pap smear today due to last Pap smear and HPV typing was 03/16/2019. Let her know BCCCP will cover Pap smears and HPV typing every 5 years unless has a history of abnormal Pap smears. Referred patient to [facility] for a diagnostic mammogram and right breast ultrasound. Appointment scheduled Thursday, Sep 06, 2020 at 1115. Patient aware of appointment and will be there. Adrienne Bjorklund verbalized understanding.  Marcell Pfeifer, Arvil Chaco, RN 11:10 AM

## 2020-09-12 ENCOUNTER — Encounter: Payer: Self-pay | Admitting: Physician Assistant

## 2020-09-25 ENCOUNTER — Telehealth: Payer: Self-pay

## 2020-09-25 ENCOUNTER — Telehealth: Payer: Self-pay | Admitting: Oncology

## 2020-09-25 NOTE — Telephone Encounter (Signed)
Scheduled appts per 5/24 sch msg. Pt's son in law is aware.

## 2020-09-25 NOTE — Telephone Encounter (Signed)
RN returned call to son in law, re: request to have her seen before traveling back to their country.   RN left voicemail for call back.  Scheduling request sent.

## 2020-10-04 ENCOUNTER — Other Ambulatory Visit: Payer: Self-pay

## 2020-10-11 ENCOUNTER — Other Ambulatory Visit: Payer: Self-pay

## 2020-10-11 MED FILL — Anastrozole Tab 1 MG: ORAL | 90 days supply | Qty: 90 | Fill #0 | Status: AC

## 2020-10-12 ENCOUNTER — Inpatient Hospital Stay (HOSPITAL_BASED_OUTPATIENT_CLINIC_OR_DEPARTMENT_OTHER): Payer: Self-pay | Admitting: Adult Health

## 2020-10-12 ENCOUNTER — Inpatient Hospital Stay: Payer: Self-pay | Attending: Adult Health

## 2020-10-12 ENCOUNTER — Encounter: Payer: Self-pay | Admitting: Adult Health

## 2020-10-12 ENCOUNTER — Other Ambulatory Visit: Payer: Self-pay

## 2020-10-12 VITALS — HR 57 | Temp 97.7°F | Resp 18 | Ht 64.0 in | Wt 150.5 lb

## 2020-10-12 DIAGNOSIS — Z923 Personal history of irradiation: Secondary | ICD-10-CM | POA: Insufficient documentation

## 2020-10-12 DIAGNOSIS — C50411 Malignant neoplasm of upper-outer quadrant of right female breast: Secondary | ICD-10-CM | POA: Insufficient documentation

## 2020-10-12 DIAGNOSIS — Z170 Estrogen receptor positive status [ER+]: Secondary | ICD-10-CM | POA: Insufficient documentation

## 2020-10-12 DIAGNOSIS — Z79811 Long term (current) use of aromatase inhibitors: Secondary | ICD-10-CM | POA: Insufficient documentation

## 2020-10-12 LAB — CMP (CANCER CENTER ONLY)
ALT: 11 U/L (ref 0–44)
AST: 22 U/L (ref 15–41)
Albumin: 3.9 g/dL (ref 3.5–5.0)
Alkaline Phosphatase: 85 U/L (ref 38–126)
Anion gap: 7 (ref 5–15)
BUN: 15 mg/dL (ref 8–23)
CO2: 27 mmol/L (ref 22–32)
Calcium: 9 mg/dL (ref 8.9–10.3)
Chloride: 106 mmol/L (ref 98–111)
Creatinine: 0.76 mg/dL (ref 0.44–1.00)
GFR, Estimated: >60 mL/min (ref >60)
Glucose, Bld: 91 mg/dL (ref 70–99)
Potassium: 3.8 mmol/L (ref 3.5–5.1)
Sodium: 140 mmol/L (ref 135–145)
Total Bilirubin: 0.5 mg/dL (ref 0.3–1.2)
Total Protein: 7.7 g/dL (ref 6.5–8.1)

## 2020-10-12 LAB — CBC WITH DIFFERENTIAL (CANCER CENTER ONLY)
Abs Immature Granulocytes: 0.03 10*3/uL (ref 0.00–0.07)
Basophils Absolute: 0.1 10*3/uL (ref 0.0–0.1)
Basophils Relative: 1 %
Eosinophils Absolute: 0.2 10*3/uL (ref 0.0–0.5)
Eosinophils Relative: 3 %
HCT: 34.9 % — ABNORMAL LOW (ref 36.0–46.0)
Hemoglobin: 11.4 g/dL — ABNORMAL LOW (ref 12.0–15.0)
Immature Granulocytes: 0 %
Lymphocytes Relative: 32 %
Lymphs Abs: 2.3 10*3/uL (ref 0.7–4.0)
MCH: 29.5 pg (ref 26.0–34.0)
MCHC: 32.7 g/dL (ref 30.0–36.0)
MCV: 90.4 fL (ref 80.0–100.0)
Monocytes Absolute: 0.6 10*3/uL (ref 0.1–1.0)
Monocytes Relative: 9 %
Neutro Abs: 3.9 10*3/uL (ref 1.7–7.7)
Neutrophils Relative %: 55 %
Platelet Count: 176 10*3/uL (ref 150–400)
RBC: 3.86 MIL/uL — ABNORMAL LOW (ref 3.87–5.11)
RDW: 13.7 % (ref 11.5–15.5)
WBC Count: 7.1 10*3/uL (ref 4.0–10.5)
nRBC: 0 % (ref 0.0–0.2)

## 2020-10-12 NOTE — Progress Notes (Signed)
Kelly Yu  [phone] Fax:(336) [phone]     ID: Kelly Yu DOB: Feb 06, 1956  MR#: [id number]  VVO#:[id number]  Patient Care Team: Patient, No Pcp Per (Inactive) as PCP - General (General Practice) [name], Virgie Dad, MD as Consulting Physician (Oncology) Rolm Bookbinder, MD as Consulting Physician (General Surgery) Kyung Rudd, MD as Consulting Physician (Radiation Oncology) Delice Bison, Charlestine Massed, NP as Nurse Practitioner (Hematology and Oncology) Scot Dock, NP OTHER MD:  CHIEF COMPLAINT: Invasive ductal carcinoma, unknown prognostic panel  CURRENT TREATMENT: anastrozole   INTERVAL HISTORY: Kelly Yu returns today for follow-up of her estrogen receptor positive breast cancer accompanied by her daughter and arabic Optometrist.  She continues on anastrozole.  She tolerates this well without any issues such as arthralgias, vaginal dryness,  or hot flashes.  She underwent a mammogram on 5/5 with ultrasound and it showed a benign cyst and no evidence of malignancy.      REVIEW OF SYSTEMS: [name] notes pain on her right side.  This is more located in her right upper quadrant of her abdomen and her right lower lung.  She says the pain is new and started this past year.  She has no nausea, no shortness of breath or cough.  The pain is every now and again.  She says it is between a 5 and 6.  Enjoys walking.  She is returning home to [place] in one week.  She will be there for two months.  She denies any other issues today and a detailed ROS was otherwise non contributory.  BREAST CANCER HISTORY: On the original intake note:  Kelly Yu tells me she had pain in the right breast, which brought her to a physician in [place], where she normally resides. They obtained a mammogram, but she does not know those results. 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 There is no prognostic panel  More recently, while visiting family in [place], the patient presented to the emergency department with a complaint of right breast pain and redness. She was afebrile. Needle aspiration was obtained and the patient was started on Augmentin. The aspirate cultures remain negative.  On 12/22/2015 she had a right breast ultrasound at the [facility]. This showed, at the 11:00 position of the right breast, 8 cm from the nipple, a large complex cystic region measuring up to 8.5 cm. There was no hyperemia.   The patient was referred to surgery, and on 01/18/2016 she had bilateral diagnostic mammography with tomography [facility] The breast density was category B. In the upper outer right breast there was a 5.7 cm mass, with an associated 1 cm satellite. There was a large firm mass deforming the contour of the breast and targeted ultrasound demonstrated a large complex collection in the upper outer quadrant of the breast measuring up to 4.8 cm. A borderline lymph node with a moderately thickened cortex was visualized in the right axilla.  Biopsy of the right breast mass and right axillary lymph node 01/25/2016 showed ([id number]) no evidence of malignancy. Cultures from the 01/25/2016 biopsy all remained negative.  She was referred to radiation oncology to receive adjuvant radiation. Her subsequent history is as detailed below   PAST MEDICAL HISTORY: Past Medical History:  Diagnosis Date   Breast  cancer [facility])    Diagnosed in [place] in July 2017, Stage IA, T1c, Nx, Mx invasive ductal carcinoma   Ulcer     PAST SURGICAL HISTORY: Past Surgical History:  Procedure Laterality Date   right breast lumpectomy      FAMILY HISTORY Family History  Problem Relation Age of Onset   Diabetes Sister   The  patient's father died from renal failure in his 56s the patient's mother died from a blood clot in her 82s. The patient had 4 brothers, 8 sisters. There is no history of breast or ovarian cancer in the family to the patient's knowledge   GYNECOLOGIC HISTORY:  No LMP recorded. Patient is postmenopausal. Menarche age 82, first live birth age 18, the patient is GX B8. She is now postmenopausal. She never took hormone replacement.   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 While visiting in the [place] she lives with her daughter [name]. 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 Also at home are the patient's son-in-law [name] , who works for [employer], and their 3 children.    ADVANCED DIRECTIVES: The patient's daughter [name] and son-in-law [name] are jointly the patient's healthcare power of attorney    HEALTH MAINTENANCE: Social History   Tobacco Use   Smoking status: Never   Smokeless tobacco: Never  Vaping Use   Vaping Use: Never used  Substance Use Topics   Alcohol use: No   Drug use: No     Colonoscopy:  PAP: 01/18/2016 / benign, + HPV  Bone density:   No Known Allergies  Current Outpatient Medications  Medication Sig Dispense Refill   anastrozole (ARIMIDEX) 1 MG tablet TAKE 1 TABLET (1 MG TOTAL) BY MOUTH DAILY. 180 tablet 1   cholecalciferol (VITAMIN D) 1000 units tablet Take 1 tablet (1,000 Units total) by mouth daily. 90 tablet 4   triamcinolone cream (KENALOG) 0.1 % Apply 1 application topically 2 (two) times daily. 30 g 0   No current facility-administered medications for this visit.    OBJECTIVE:   Vitals:   10/12/20 1411  Pulse: (!) 57  Resp: 18  Temp: 97.7 F (36.5 C)     Body mass index is 25.83 kg/m.    ECOG FS:1 - Symptomatic but completely ambulatory GENERAL: Patient is a well appearing female in no acute distress HEENT:  Sclerae anicteric.  Maks in place.  Neck is supple.  NODES:  No  cervical, supraclavicular, or axillary lymphadenopathy palpated.  BREAST EXAM:  left breast benign, right breast with small well circumscribed cyst (as per last months ultrasound), no sign of recurrence LUNGS:  Clear to auscultation bilaterally.  No wheezes or rhonchi. HEART:  Regular rate and rhythm. No murmur appreciated. ABDOMEN:  Soft, nontender.  Positive, normoactive bowel sounds. No organomegaly palpated. MSK:  No focal spinal tenderness to palpation. Full range of motion bilaterally in the upper extremities. EXTREMITIES:  No peripheral edema.   SKIN:  Clear with no obvious rashes or skin changes. No nail dyscrasia. NEURO:  Nonfocal. Well oriented.  Appropriate affect.    LAB RESULTS:  CMP     Component Value Date/Time   NA 140 10/12/2020 1356   NA 141 12/15/2016 1606   K 3.8 10/12/2020 1356   K 4.1 12/15/2016 1606   CL 106 10/12/2020 1356   CO2 27 10/12/2020 1356   CO2 27 12/15/2016 1606   GLUCOSE 91 10/12/2020 1356   GLUCOSE 124 12/15/2016  1606   BUN 15 10/12/2020 1356   BUN 17.1 12/15/2016 1606   CREATININE 0.76 10/12/2020 1356   CREATININE 0.8 12/15/2016 1606   CALCIUM 9.0 10/12/2020 1356   CALCIUM 9.3 12/15/2016 1606   PROT 7.7 10/12/2020 1356   PROT 7.5 12/15/2016 1606   ALBUMIN 3.9 10/12/2020 1356   ALBUMIN 3.7 12/15/2016 1606   AST 22 10/12/2020 1356   AST 21 12/15/2016 1606   ALT 11 10/12/2020 1356   ALT 16 12/15/2016 1606   ALKPHOS 85 10/12/2020 1356   ALKPHOS 103 12/15/2016 1606   BILITOT 0.5 10/12/2020 1356   BILITOT 0.36 12/15/2016 1606   GFRNONAA >60 10/12/2020 1356   GFRAA >60 03/08/2019 1017    INo results found for: SPEP, UPEP  Lab Results  Component Value Date   WBC 7.1 10/12/2020   NEUTROABS 3.9 10/12/2020   HGB 11.4 (L) 10/12/2020   HCT 34.9 (L) 10/12/2020   MCV 90.4 10/12/2020   PLT 176 10/12/2020      Chemistry      Component Value Date/Time   NA 140 10/12/2020 1356   NA 141 12/15/2016 1606   K 3.8 10/12/2020 1356   K 4.1  12/15/2016 1606   CL 106 10/12/2020 1356   CO2 27 10/12/2020 1356   CO2 27 12/15/2016 1606   BUN 15 10/12/2020 1356   BUN 17.1 12/15/2016 1606   CREATININE 0.76 10/12/2020 1356   CREATININE 0.8 12/15/2016 1606      Component Value Date/Time   CALCIUM 9.0 10/12/2020 1356   CALCIUM 9.3 12/15/2016 1606   ALKPHOS 85 10/12/2020 1356   ALKPHOS 103 12/15/2016 1606   AST 22 10/12/2020 1356   AST 21 12/15/2016 1606   ALT 11 10/12/2020 1356   ALT 16 12/15/2016 1606   BILITOT 0.5 10/12/2020 1356   BILITOT 0.36 12/15/2016 1606       No results found for: LABCA2  No components found for: LABCA125  No results for input(s): INR in the last 168 hours.  Urinalysis    Component Value Date/Time   COLORURINE YELLOW 11/13/2012 1059   APPEARANCEUR CLEAR 11/13/2012 1059   LABSPEC 1.010 11/13/2012 1059   PHURINE 8.5 (H) 11/13/2012 1059   GLUCOSEU NEGATIVE 11/13/2012 1059   HGBUR NEGATIVE 11/13/2012 1059   BILIRUBINUR NEGATIVE 11/13/2012 1059   KETONESUR NEGATIVE 11/13/2012 1059   PROTEINUR NEGATIVE 11/13/2012 1059   UROBILINOGEN 0.2 11/13/2012 1059   NITRITE NEGATIVE 11/13/2012 1059   LEUKOCYTESUR TRACE (A) 11/13/2012 1059     STUDIES:    ELIGIBLE FOR AVAILABLE RESEARCH PROTOCOL: No  ASSESSMENT: 65 y.o.  Woman, originally from [place], status post right breast upper outer quadrant lumpectomy July 2017 for a pT1c pNX, stage IA invasive ductal carcinoma,  grade 3, prognostic panel not available  (1) complex cystic mass in the right breast and suspicious right axillary lymph node both biopsied 01/25/2016, showing no evidence of malignancy. Multiple cultures were negative as well   (2) adjuvant radiation 03/23/2016 to 05/15/2015:   (a) Right Breast / 50.4 Gy in 28 fractions  (b) Boost / 14 Gy in 7 fractions  (3) started anastrozole 06/05/2016  (a) bone density at the [facility] 12/08/2016 finds a T score of -1.5  PLAN: Kelly Yu is here today for follow up of her stage  IA breast cancer.  She has no clinical or radiographic sign of breast cancer recurrence.  She continues on anastrozole with good tolerance and will continue this through 07/2021.  She understands this.  She is due for her next mammogram in 09/2021.  She was recommended continued healthy diet and exercise.  WE will see her back in 07/2021 as scheduled.  She knows to call for any questions that may arise between now and her next appointment.  We are happy to see her sooner if needed..  Total encounter time: 20 minutes in face to face visit time, chart review, lab review, order entry and documentaiton.   Wilber Bihari, NP 10/12/20 2:41 PM Medical Oncology and Hematology [facility] [place], [place] [address] Tel. [phone]    Fax. [phone]   *Total Encounter Time as defined by the Centers for Medicare and Medicaid Services includes, in addition to the face-to-face time of a patient visit (documented in the note above) non-face-to-face time: obtaining and reviewing outside history, ordering and reviewing medications, tests or procedures, care coordination (communications with other health care professionals or caregivers) and documentation in the medical record.

## 2020-10-22 ENCOUNTER — Ambulatory Visit: Payer: Self-pay | Admitting: Oncology

## 2020-10-22 ENCOUNTER — Other Ambulatory Visit: Payer: Self-pay

## 2021-07-11 ENCOUNTER — Telehealth: Payer: Self-pay | Admitting: Adult Health

## 2021-07-11 NOTE — Telephone Encounter (Signed)
Rescheduled appointment per provider (PAL). Left message. Tried calling patients home and cell phone. A voicemail was left on the home phone number. Unable to leave a voicemail on the cell phone due to mailbox being full. Calls were made using the [employer] provided by Barry. ?

## 2021-07-15 ENCOUNTER — Inpatient Hospital Stay: Payer: Self-pay | Admitting: Adult Health

## 2021-07-15 ENCOUNTER — Inpatient Hospital Stay: Payer: Self-pay

## 2021-07-17 ENCOUNTER — Other Ambulatory Visit: Payer: Self-pay

## 2021-07-17 DIAGNOSIS — C50411 Malignant neoplasm of upper-outer quadrant of right female breast: Secondary | ICD-10-CM

## 2021-07-18 ENCOUNTER — Inpatient Hospital Stay (HOSPITAL_BASED_OUTPATIENT_CLINIC_OR_DEPARTMENT_OTHER): Payer: Self-pay | Admitting: Adult Health

## 2021-07-18 ENCOUNTER — Other Ambulatory Visit: Payer: Self-pay

## 2021-07-18 ENCOUNTER — Inpatient Hospital Stay: Payer: Self-pay | Attending: Adult Health

## 2021-07-18 ENCOUNTER — Encounter: Payer: Self-pay | Admitting: Adult Health

## 2021-07-18 VITALS — BP 161/85 | HR 68 | Temp 97.7°F | Resp 18 | Ht 64.0 in | Wt 144.0 lb

## 2021-07-18 DIAGNOSIS — C50411 Malignant neoplasm of upper-outer quadrant of right female breast: Secondary | ICD-10-CM

## 2021-07-18 DIAGNOSIS — M8580 Other specified disorders of bone density and structure, unspecified site: Secondary | ICD-10-CM | POA: Insufficient documentation

## 2021-07-18 DIAGNOSIS — Z79811 Long term (current) use of aromatase inhibitors: Secondary | ICD-10-CM | POA: Insufficient documentation

## 2021-07-18 DIAGNOSIS — Z170 Estrogen receptor positive status [ER+]: Secondary | ICD-10-CM | POA: Insufficient documentation

## 2021-07-18 DIAGNOSIS — Z923 Personal history of irradiation: Secondary | ICD-10-CM | POA: Insufficient documentation

## 2021-07-18 LAB — CBC WITH DIFFERENTIAL (CANCER CENTER ONLY)
Abs Immature Granulocytes: 0.02 10*3/uL (ref 0.00–0.07)
Basophils Absolute: 0 10*3/uL (ref 0.0–0.1)
Basophils Relative: 1 %
Eosinophils Absolute: 0.2 10*3/uL (ref 0.0–0.5)
Eosinophils Relative: 3 %
HCT: 35.9 % — ABNORMAL LOW (ref 36.0–46.0)
Hemoglobin: 11.8 g/dL — ABNORMAL LOW (ref 12.0–15.0)
Immature Granulocytes: 0 %
Lymphocytes Relative: 31 %
Lymphs Abs: 1.9 10*3/uL (ref 0.7–4.0)
MCH: 30 pg (ref 26.0–34.0)
MCHC: 32.9 g/dL (ref 30.0–36.0)
MCV: 91.3 fL (ref 80.0–100.0)
Monocytes Absolute: 0.6 10*3/uL (ref 0.1–1.0)
Monocytes Relative: 9 %
Neutro Abs: 3.4 10*3/uL (ref 1.7–7.7)
Neutrophils Relative %: 56 %
Platelet Count: 187 10*3/uL (ref 150–400)
RBC: 3.93 MIL/uL (ref 3.87–5.11)
RDW: 13.8 % (ref 11.5–15.5)
WBC Count: 6.1 10*3/uL (ref 4.0–10.5)
nRBC: 0 % (ref 0.0–0.2)

## 2021-07-18 LAB — CMP (CANCER CENTER ONLY)
ALT: 10 U/L (ref 0–44)
AST: 20 U/L (ref 15–41)
Albumin: 4.5 g/dL (ref 3.5–5.0)
Alkaline Phosphatase: 85 U/L (ref 38–126)
Anion gap: 6 (ref 5–15)
BUN: 16 mg/dL (ref 8–23)
CO2: 30 mmol/L (ref 22–32)
Calcium: 9.7 mg/dL (ref 8.9–10.3)
Chloride: 104 mmol/L (ref 98–111)
Creatinine: 0.74 mg/dL (ref 0.44–1.00)
GFR, Estimated: >60 mL/min (ref >60)
Glucose, Bld: 114 mg/dL — ABNORMAL HIGH (ref 70–99)
Potassium: 4.1 mmol/L (ref 3.5–5.1)
Sodium: 140 mmol/L (ref 135–145)
Total Bilirubin: 0.4 mg/dL (ref 0.3–1.2)
Total Protein: 8.1 g/dL (ref 6.5–8.1)

## 2021-07-18 NOTE — Assessment & Plan Note (Signed)
Kelly Yu is a 66 year old Arabic woman who is here today for follow-up of her stage Ia right-sided breast cancer status postlumpectomy, adjuvant radiation, and 5 years of anastrozole completing today. ? ?1.  Stage Ia breast cancer: He has completed 5 years of antiestrogen therapy and I told her that she can go ahead and discontinue her therapy.  It is unclear if she had estrogen positive breast cancer therefore considering the small amount of benefit that only a small amount of patients who are estrogen positive get from taking extended antiestrogen therapy I do not recommend in her case as the risks outweigh the benefits. ? ?2.  Osteopenia: Her bone density in May 2022 showed a T score of -1.9.  We discussed calcium, she is taking vitamin D and will continue this, and I recommended weightbearing exercises. ? ?3. Health maintenance: She does not have a primary care provider.  I discussed that we do recommend that she get in touch with primary care so that she can have someone checking her labs and taking care of of preventative health, in particular her cardiac health.  Placed a referral to community health and wellness since she does not have insurance. ? ?Ilayda does not have to return to see [place], however we are happy to see her on an annual basis and long-term survivorship, which she would like to participate in.  We will see her back in 1 year for follow-up. ?

## 2021-07-18 NOTE — Progress Notes (Signed)
[facility] Cancer Follow up:    Patient, No Pcp Per (Inactive) No address on file   DIAGNOSIS:  Cancer Staging  Malignant neoplasm of upper-outer quadrant of right breast in female, estrogen receptor positive (HCC) Staging form: Breast, AJCC 7th Edition - Pathologic: Stage IA (T1c, N0, cM0) - Unsigned   SUMMARY OF ONCOLOGIC HISTORY: Oncology History  Malignant neoplasm of upper-outer quadrant of right breast in female, estrogen receptor positive (HCC)  11/2015 Initial Diagnosis   Malignant neoplasm of upper-outer quadrant of right breast in female, estrogen receptor positive (HCC)   11/2015 Surgery   Right upper outer quadrant lumpectomy: T1c, Nx, grade 3, no prognostic profile available (surgery Yu in [place]).     03/23/2016 - 05/14/2016 Radiation Therapy   Right breast: 50.4Gy in 28 fractions, Boost: 14 Gy in 7 fractions   06/2016 -  Anti-estrogen oral therapy   Anastrozole     CURRENT THERAPY: anastrozole  INTERVAL HISTORY:  Kelly Yu 66 y.o. female returns for evaluation of her right-sided breast cancer.  She is accompanied by her daughter and an interpreter who both speak fluent Arabic and [place].  She has taken 5 years of antiestrogen therapy with anastrozole and has still been taking this.  She wants to know if she can now stop it since she has completed her 5-years.  Her most recent mammogram was completed on Sep 06, 2020.  It showed an unspecified lump in the right breast that needed diagnostic mammogram and ultrasound which ruled out malignancy and repeat mammogram was recommended in May 2023.  Her most recent bone density testing was completed in May 2022 and showed osteopenia with a T score -1.9.  She is taking vitamin D daily.  She enjoys walking when the weather is a little bit warmer than it is on days like today.  She does not have a primary care provider.   Patient Active Problem List   Diagnosis Date Noted   Well woman exam with routine  gynecological exam 03/16/2019   Aortic atherosclerosis (HCC) 07/06/2018   Malignant neoplasm of upper-outer quadrant of right breast in female, estrogen receptor positive (HCC) 03/13/2016   Seroma, postoperative of breast 12/22/2015    has No Known Allergies.  MEDICAL HISTORY: Past Medical History:  Diagnosis Date   Breast cancer (HCC)    Diagnosed in [place] in July 2017, Stage IA, T1c, Nx, Mx invasive ductal carcinoma   Ulcer     SURGICAL HISTORY: Past Surgical History:  Procedure Laterality Date   right breast lumpectomy      SOCIAL HISTORY: Social History   Socioeconomic History   Marital status: Widowed    Spouse name: Not on file   Number of children: 8   Years of education: Not on file   Highest education level: Never attended school  Occupational History   Not on file  Tobacco Use   Smoking status: Never   Smokeless tobacco: Never  Vaping Use   Vaping Use: Never used  Substance and Sexual Activity   Alcohol use: No   Drug use: No   Sexual activity: Not Currently    Birth control/protection: None  Other Topics Concern   Not on file  Social History Narrative   Originally from [place]   Social Determinants of Health   Financial Resource Strain: Not on file  Food Insecurity: Not on file  Transportation Needs: No Transportation Needs   Lack of Transportation (Medical): No   Lack of Transportation (Non-Medical): No  Physical  Activity: Not on file  Stress: Not on file  Social Connections: Not on file  Intimate Partner Violence: Not on file    FAMILY HISTORY: Family History  Problem Relation Age of Onset   Diabetes Sister     Review of Systems  Constitutional:  Negative for appetite change, chills, fatigue, fever and unexpected weight change.  HENT:   Negative for hearing loss, lump/mass and trouble swallowing.   Eyes:  Negative for eye problems and icterus.  Respiratory:  Negative for chest tightness, cough and shortness of breath.    Cardiovascular:  Negative for chest pain, leg swelling and palpitations.  Gastrointestinal:  Negative for abdominal distention, abdominal pain, constipation, diarrhea, nausea and vomiting.  Endocrine: Negative for hot flashes.  Genitourinary:  Negative for difficulty urinating.   Musculoskeletal:  Negative for arthralgias.  Skin:  Negative for itching and rash.  Neurological:  Negative for dizziness, extremity weakness, headaches and numbness.  Hematological:  Negative for adenopathy. Does not bruise/bleed easily.  Psychiatric/Behavioral:  Negative for depression. The patient is not nervous/anxious.      PHYSICAL EXAMINATION  ECOG PERFORMANCE STATUS: 1 - Symptomatic but completely ambulatory  Vitals:   07/18/21 1108  BP: (!) 161/85  Pulse: 68  Resp: 18  Temp: 97.7 F (36.5 C)  SpO2: 100%    Physical Exam Constitutional:      General: She is not in acute distress.    Appearance: Normal appearance. She is not toxic-appearing.  HENT:     Head: Normocephalic and atraumatic.  Eyes:     General: No scleral icterus. Cardiovascular:     Rate and Rhythm: Normal rate and regular rhythm.     Pulses: Normal pulses.     Heart sounds: Normal heart sounds.  Pulmonary:     Effort: Pulmonary effort is normal.     Breath sounds: Normal breath sounds.  Chest:     Comments: Right breast status postlumpectomy and radiation, the small known calcifications are palpable and unchanged.  No sign of local recurrence.  Left breast is benign Abdominal:     General: Abdomen is flat. Bowel sounds are normal. There is no distension.     Palpations: Abdomen is soft.     Tenderness: There is no abdominal tenderness.  Musculoskeletal:        General: No swelling.     Cervical back: Neck supple.  Lymphadenopathy:     Cervical: No cervical adenopathy.  Skin:    General: Skin is warm and dry.     Findings: No rash.  Neurological:     General: No focal deficit present.     Mental Status: She is  alert.  Psychiatric:        Mood and Affect: Mood normal.        Behavior: Behavior normal.    LABORATORY DATA:  CBC    Component Value Date/Time   WBC 6.1 07/18/2021 1045   WBC 6.8 03/08/2019 1017   RBC 3.93 07/18/2021 1045   HGB 11.8 (L) 07/18/2021 1045   HGB 11.7 12/15/2016 1606   HCT 35.9 (L) 07/18/2021 1045   HCT 34.7 (L) 12/15/2016 1606   PLT 187 07/18/2021 1045   PLT 202 12/15/2016 1606   MCV 91.3 07/18/2021 1045   MCV 87.7 12/15/2016 1606   MCH 30.0 07/18/2021 1045   MCHC 32.9 07/18/2021 1045   RDW 13.8 07/18/2021 1045   RDW 14.2 12/15/2016 1606   LYMPHSABS 1.9 07/18/2021 1045   LYMPHSABS 1.9 12/15/2016 1606  MONOABS 0.6 07/18/2021 1045   MONOABS 0.6 12/15/2016 1606   EOSABS 0.2 07/18/2021 1045   EOSABS 0.2 12/15/2016 1606   BASOSABS 0.0 07/18/2021 1045   BASOSABS 0.1 12/15/2016 1606    CMP     Component Value Date/Time   NA 140 07/18/2021 1045   NA 141 12/15/2016 1606   K 4.1 07/18/2021 1045   K 4.1 12/15/2016 1606   CL 104 07/18/2021 1045   CO2 30 07/18/2021 1045   CO2 27 12/15/2016 1606   GLUCOSE 114 (H) 07/18/2021 1045   GLUCOSE 124 12/15/2016 1606   BUN 16 07/18/2021 1045   BUN 17.1 12/15/2016 1606   CREATININE 0.74 07/18/2021 1045   CREATININE 0.8 12/15/2016 1606   CALCIUM 9.7 07/18/2021 1045   CALCIUM 9.3 12/15/2016 1606   PROT 8.1 07/18/2021 1045   PROT 7.5 12/15/2016 1606   ALBUMIN 4.5 07/18/2021 1045   ALBUMIN 3.7 12/15/2016 1606   AST 20 07/18/2021 1045   AST 21 12/15/2016 1606   ALT 10 07/18/2021 1045   ALT 16 12/15/2016 1606   ALKPHOS 85 07/18/2021 1045   ALKPHOS 103 12/15/2016 1606   BILITOT 0.4 07/18/2021 1045   BILITOT 0.36 12/15/2016 1606   GFRNONAA >60 07/18/2021 1045   GFRAA >60 03/08/2019 1017     ASSESSMENT and THERAPY PLAN:   Malignant neoplasm of upper-outer quadrant of right breast in female, estrogen receptor positive (HCC) Kelly Yu is a 66 year old Arabic woman who is here today for follow-up of her stage Ia  right-sided breast cancer status postlumpectomy, adjuvant radiation, and 5 years of anastrozole completing today.  1.  Stage Ia breast cancer: He has completed 5 years of antiestrogen therapy and I told her that she can go ahead and discontinue her therapy.  It is unclear if she had estrogen positive breast cancer therefore considering the small amount of benefit that only a small amount of patients who are estrogen positive get from taking extended antiestrogen therapy I do not recommend in her case as the risks outweigh the benefits.  2.  Osteopenia: Her bone density in May 2022 showed a T score of -1.9.  We discussed calcium, she is taking vitamin D and will continue this, and I recommended weightbearing exercises.  3. Health maintenance: She does not have a primary care provider.  I discussed that we do recommend that she get in touch with primary care so that she can have someone checking her labs and taking care of of preventative health, in particular her cardiac health.  Placed a referral to community health and wellness since she does not have insurance.  Kelly Yu does not have to return to see [place], however we are happy to see her on an annual basis and long-term survivorship, which she would like to participate in.  We will see her back in 1 year for follow-up.   All questions were answered. The patient knows to call the clinic with any problems, questions or concerns. We can certainly see the patient much sooner if necessary.  Total encounter time: 30 minutes in face-to-face visit time, chart review, lab review, care coordination, order entry, and documentation of the encounter.  Lillard Anes, NP 07/18/21 12:03 PM Medical Oncology and Hematology [facility] [address] [place], [place] [address] Tel. [phone]    Fax. [phone]  *Total Encounter Time as defined by the Centers for Medicare and Medicaid Services includes, in addition to the face-to-face time of a  patient visit (documented in the  note above) non-face-to-face time: obtaining and reviewing outside history, ordering and reviewing medications, tests or procedures, care coordination (communications with other health care professionals or caregivers) and documentation in the medical record.

## 2021-07-30 ENCOUNTER — Telehealth: Payer: Self-pay

## 2021-07-30 NOTE — Telephone Encounter (Signed)
Telephoned patient at both numbers. Left a voice message with [facility] contact information. Language line interpreter#[phone] ?

## 2021-09-26 ENCOUNTER — Ambulatory Visit: Payer: Self-pay | Admitting: *Deleted

## 2021-09-26 VITALS — BP 180/98 | Wt 144.4 lb

## 2021-09-26 DIAGNOSIS — Z1239 Encounter for other screening for malignant neoplasm of breast: Secondary | ICD-10-CM

## 2021-09-26 NOTE — Progress Notes (Signed)
Kelly Yu is a 66 y.o. female who presents to [facility] clinic today with no complaints.    Pap Smear: Pap smear not completed today. Last Pap smear was 03/16/2019 at [facility] clinic and was normal with negative HPV. Per patient has no history of an abnormal Pap smear. Last Pap smear result is available in Epic.   Physical exam: Breasts Left breast slightly larger than right breast due to history of right breast lumpectomy in July 2017. No skin abnormalities bilateral breasts. No nipple retraction bilateral breasts. No nipple discharge bilateral breasts. No lymphadenopathy. No lumps palpated left breast. Palpated a lump within the right breast at 10 o'clock 7 cm from the nipple consistent with previous exam 08/30/2020 that a diagnostic mammogram, right breast ultrasound, and right breast biopsy was completed for follow up that was benign. No complaints of pain or tenderness on exam.     Pelvic/Bimanual Pap is not indicated today per BCCCP guidelines.   Smoking History: Patient has never smoked.   Patient Navigation: Patient education provided. Access to services provided for patient through [facility] program. Arabic interpreter Mardi Mainland from [facility] provided.   Colorectal Cancer Screening: Per patient has never had colonoscopy completed. No complaints today.    Breast and Cervical Cancer Risk Assessment: Patient has no family history of breast cancer. Patient has a personal history of breast cancer. Patient has no known genetic mutations or history of radiation treatment to the chest before age 52. Patient has no history of cervical dysplasia, immunocompromised, or DES exposure in-utero.  Risk Assessment     Risk Scores       09/26/2021 08/30/2020   Last edited by: Demetrius Revel, LPN McGill, Sherie Mamie Nick, LPN   5-year risk: 1.6 % 1.6 %   Lifetime risk: 5.6 % 5.8 %            A: BCCCP exam without pap smear No complaints.  P: Referred patient to [facility] for a  screening mammogram. Appointment scheduled Thursday, Sep 26, 2021 at 1445.  Kelly Parish, RN 09/26/2021 1:22 PM

## 2021-09-26 NOTE — Patient Instructions (Signed)
Explained breast self awareness with Kelly Yu. Patient did not need a Pap smear today due to last Pap smear and HPV typing was 03/16/2019. Let her know BCCCP will cover Pap smears and HPV typing every 5 years unless has a history of abnormal Pap smears. Referred patient to [facility] for a screening mammogram. Appointment scheduled Thursday, Sep 26, 2021 at 1445. Patient aware of appointment and will be there. Let patient know Kelly Yu will follow up with her within the next couple weeks with results of mammogram by letter or phone. Kelly Yu verbalized understanding.  Kelly Yu, Kelly Chaco, RN 1:22 PM

## 2022-07-18 ENCOUNTER — Telehealth: Payer: Self-pay | Admitting: Adult Health

## 2022-07-18 NOTE — Telephone Encounter (Signed)
Per 3/15 IB reached out to patient to answer questions regarding appointment, left voicemail.

## 2022-07-21 ENCOUNTER — Inpatient Hospital Stay: Payer: Self-pay | Admitting: Adult Health

## 2022-12-16 ENCOUNTER — Telehealth: Payer: Self-pay | Admitting: Adult Health

## 2022-12-16 NOTE — Telephone Encounter (Signed)
Scheduled appointment per son in law's request via incoming call. The son in law is aware of the made appointment for the patient.

## 2023-02-03 ENCOUNTER — Inpatient Hospital Stay: Payer: Self-pay | Attending: Adult Health | Admitting: Adult Health

## 2023-05-26 ENCOUNTER — Ambulatory Visit: Payer: Self-pay | Admitting: *Deleted

## 2023-05-26 VITALS — BP 168/83 | Wt 150.5 lb

## 2023-05-26 DIAGNOSIS — Z1211 Encounter for screening for malignant neoplasm of colon: Secondary | ICD-10-CM

## 2023-05-26 DIAGNOSIS — N6311 Unspecified lump in the right breast, upper outer quadrant: Secondary | ICD-10-CM

## 2023-05-26 DIAGNOSIS — Z01419 Encounter for gynecological examination (general) (routine) without abnormal findings: Secondary | ICD-10-CM

## 2023-05-26 NOTE — Patient Instructions (Signed)
Explained breast self awareness with [name]. Pap smear completed today. Let patient know that follow up will be based on the result of today's Pap smear. Referred patient to [name] for a diagnostic mammogram. Appointment scheduled Tuesday, June 02, 2023 at 0945. Patient aware of appointment and will be there. Let patient know will follow up with her within the next couple weeks with results of Pap smear by letter or phone. Kelly Yu verbalized understanding.  Raymona Boss, Kathaleen Maser, RN 2:05 PM

## 2023-05-26 NOTE — Progress Notes (Signed)
Kelly Yu is a 68 y.o. female who presents to [facility] clinic today with no complaints.    Pap Smear: Pap smear completed today. Last Pap smear was 03/16/2019 at [facility] clinic and was normal with negative HPV. Patient has a history of her previous Pap smear 01/18/2016 was normal with positive HPV. Last two Pap smear results are available in Epic.   Physical exam: Breasts Left breast slightly larger than right breast due to history of right breast lumpectomy in July 2017. No skin abnormalities bilateral breasts. No nipple retraction bilateral breasts. No nipple discharge bilateral breasts. No lymphadenopathy. No lumps palpated left breast. Palpated a lump within the right breast at 10 o'clock 7 cm from the nipple consistent with previous exam 09/26/2021 and 08/30/2020 that a diagnostic mammogram, right breast ultrasound, and right breast biopsy was completed for follow up that was benign. No complaints of pain or tenderness on exam.      Pelvic/Bimanual Ext Genitalia No lesions, no swelling and no discharge observed on external genitalia.        Vagina Vagina pink and normal texture. No lesions or discharge observed in vagina.        Cervix Cervix is present. Cervix pink and of normal texture. No discharge observed.    Uterus Uterus is present and palpable. Uterus in normal position and normal size.        Adnexae Bilateral ovaries present and palpable. No tenderness on palpation.         Rectovaginal No rectal exam completed today since patient had no rectal complaints. No skin abnormalities observed on exam.     Smoking History: Patient has never smoked.  Patient Navigation: Patient education provided. Access to services provided for patient through [facility] program. Arabic interpreter Jodene Nam from [facility] provided.   Colorectal Cancer Screening: Per patient has never had colonoscopy completed. FIT Test given to patient to complete. No complaints today.    Breast and Cervical  Cancer Risk Assessment: Patient has no family history of breast cancer. Patient has a personal history of breast cancer. Patient has no known genetic mutations or history of radiation treatment to the chest before age 39. Patient has no history of cervical dysplasia, immunocompromised, or DES exposure in-utero. Breast cancer risk assessment completed. No breast cancer risk calculated due to patients history of breast cancer.  Risk Assessment   No risk assessment data     A: BCCCP exam without pap smear No complaints.  P: Referred patient to [facility] for a diagnostic mammogram. Appointment scheduled Tuesday, June 02, 2023 at 0945.   Priscille Heidelberg, RN 05/26/2023 2:04 PM

## 2023-06-01 LAB — CYTOLOGY - PAP
Comment: NEGATIVE
Diagnosis: NEGATIVE
High risk HPV: NEGATIVE

## 2023-06-02 ENCOUNTER — Telehealth: Payer: Self-pay

## 2023-06-02 NOTE — Telephone Encounter (Addendum)
I have left a detailed message for pts daughter, [name], advising pts PAP/FIT was normal and there is no need to repeat it. She was invited to call back if she had questions/concerns regarding pts PAP/FIT results.

## 2023-06-03 LAB — FECAL OCCULT BLOOD, IMMUNOCHEMICAL: Fecal Occult Bld: NEGATIVE

## 2023-06-03 LAB — SPECIMEN STATUS REPORT

## 2023-06-03 NOTE — Progress Notes (Signed)
The result is negative, and listed on the report. Thanks,  [employer]

## 2023-08-20 ENCOUNTER — Other Ambulatory Visit: Payer: Self-pay
